# Patient Record
Sex: Female | Born: 1935 | Race: White | Hispanic: No | Marital: Single | Smoking: Former smoker
Health system: Southern US, Community
[De-identification: ages and names within clinical notes are randomized; demographics above are authoritative.]

## PROBLEM LIST (undated history)

## (undated) DIAGNOSIS — I10 Essential (primary) hypertension: Secondary | ICD-10-CM

## (undated) DIAGNOSIS — J45909 Unspecified asthma, uncomplicated: Secondary | ICD-10-CM

## (undated) DIAGNOSIS — C801 Malignant (primary) neoplasm, unspecified: Secondary | ICD-10-CM

## (undated) DIAGNOSIS — R7303 Prediabetes: Secondary | ICD-10-CM

## (undated) DIAGNOSIS — M1990 Unspecified osteoarthritis, unspecified site: Secondary | ICD-10-CM

## (undated) HISTORY — PX: APPENDECTOMY: SHX54

## (undated) HISTORY — PX: TONSILLECTOMY: SUR1361

## (undated) HISTORY — PX: CHOLECYSTECTOMY: SHX55

## (undated) HISTORY — DX: Essential (primary) hypertension: I10

## (undated) HISTORY — PX: BACK SURGERY: SHX140

## (undated) HISTORY — PX: OTHER SURGICAL HISTORY: SHX169

---

## 2000-06-17 ENCOUNTER — Encounter (HOSPITAL_COMMUNITY): Admission: RE | Admit: 2000-06-17 | Discharge: 2000-09-15 | Payer: Self-pay | Admitting: Orthopedic Surgery

## 2000-06-17 ENCOUNTER — Encounter: Payer: Self-pay | Admitting: Orthopedic Surgery

## 2000-07-27 ENCOUNTER — Encounter: Payer: Self-pay | Admitting: Orthopedic Surgery

## 2000-07-31 ENCOUNTER — Inpatient Hospital Stay (HOSPITAL_COMMUNITY): Admission: RE | Admit: 2000-07-31 | Discharge: 2000-08-03 | Payer: Self-pay | Admitting: Orthopedic Surgery

## 2000-07-31 ENCOUNTER — Encounter: Payer: Self-pay | Admitting: Orthopedic Surgery

## 2000-12-30 ENCOUNTER — Other Ambulatory Visit: Admission: RE | Admit: 2000-12-30 | Discharge: 2000-12-30 | Payer: Self-pay | Admitting: Otolaryngology

## 2014-06-19 DIAGNOSIS — M1711 Unilateral primary osteoarthritis, right knee: Secondary | ICD-10-CM | POA: Insufficient documentation

## 2014-06-19 DIAGNOSIS — E559 Vitamin D deficiency, unspecified: Secondary | ICD-10-CM | POA: Insufficient documentation

## 2014-06-19 DIAGNOSIS — I10 Essential (primary) hypertension: Secondary | ICD-10-CM | POA: Insufficient documentation

## 2014-06-19 DIAGNOSIS — E039 Hypothyroidism, unspecified: Secondary | ICD-10-CM | POA: Insufficient documentation

## 2015-10-28 DIAGNOSIS — Z6835 Body mass index (BMI) 35.0-35.9, adult: Secondary | ICD-10-CM | POA: Insufficient documentation

## 2015-10-28 DIAGNOSIS — E875 Hyperkalemia: Secondary | ICD-10-CM | POA: Insufficient documentation

## 2015-10-28 DIAGNOSIS — H16223 Keratoconjunctivitis sicca, not specified as Sjogren's, bilateral: Secondary | ICD-10-CM | POA: Insufficient documentation

## 2015-10-28 DIAGNOSIS — H02831 Dermatochalasis of right upper eyelid: Secondary | ICD-10-CM | POA: Insufficient documentation

## 2015-10-28 DIAGNOSIS — N183 Chronic kidney disease, stage 3 unspecified: Secondary | ICD-10-CM | POA: Insufficient documentation

## 2015-10-28 DIAGNOSIS — H9190 Unspecified hearing loss, unspecified ear: Secondary | ICD-10-CM | POA: Insufficient documentation

## 2015-10-28 DIAGNOSIS — K219 Gastro-esophageal reflux disease without esophagitis: Secondary | ICD-10-CM | POA: Insufficient documentation

## 2015-10-28 DIAGNOSIS — Z79899 Other long term (current) drug therapy: Secondary | ICD-10-CM | POA: Insufficient documentation

## 2015-10-28 DIAGNOSIS — M542 Cervicalgia: Secondary | ICD-10-CM | POA: Insufficient documentation

## 2015-10-28 DIAGNOSIS — H2513 Age-related nuclear cataract, bilateral: Secondary | ICD-10-CM | POA: Insufficient documentation

## 2015-10-28 DIAGNOSIS — M1990 Unspecified osteoarthritis, unspecified site: Secondary | ICD-10-CM | POA: Insufficient documentation

## 2015-10-28 DIAGNOSIS — H02834 Dermatochalasis of left upper eyelid: Secondary | ICD-10-CM | POA: Insufficient documentation

## 2015-10-28 DIAGNOSIS — B009 Herpesviral infection, unspecified: Secondary | ICD-10-CM | POA: Insufficient documentation

## 2015-10-28 DIAGNOSIS — H43391 Other vitreous opacities, right eye: Secondary | ICD-10-CM | POA: Insufficient documentation

## 2016-04-03 DIAGNOSIS — H2512 Age-related nuclear cataract, left eye: Secondary | ICD-10-CM | POA: Insufficient documentation

## 2016-05-06 DIAGNOSIS — Z0000 Encounter for general adult medical examination without abnormal findings: Secondary | ICD-10-CM | POA: Insufficient documentation

## 2016-05-06 DIAGNOSIS — R7301 Impaired fasting glucose: Secondary | ICD-10-CM | POA: Insufficient documentation

## 2017-04-29 DIAGNOSIS — R200 Anesthesia of skin: Secondary | ICD-10-CM | POA: Insufficient documentation

## 2017-10-09 DIAGNOSIS — G5601 Carpal tunnel syndrome, right upper limb: Secondary | ICD-10-CM | POA: Insufficient documentation

## 2018-01-06 DIAGNOSIS — M79661 Pain in right lower leg: Secondary | ICD-10-CM | POA: Insufficient documentation

## 2018-01-06 DIAGNOSIS — M79662 Pain in left lower leg: Secondary | ICD-10-CM | POA: Insufficient documentation

## 2018-05-10 DIAGNOSIS — R739 Hyperglycemia, unspecified: Secondary | ICD-10-CM | POA: Insufficient documentation

## 2018-09-10 DIAGNOSIS — R7303 Prediabetes: Secondary | ICD-10-CM | POA: Insufficient documentation

## 2019-03-16 DIAGNOSIS — E871 Hypo-osmolality and hyponatremia: Secondary | ICD-10-CM | POA: Insufficient documentation

## 2019-11-30 ENCOUNTER — Ambulatory Visit: Payer: Medicare PPO | Admitting: Physical Medicine and Rehabilitation

## 2019-11-30 ENCOUNTER — Ambulatory Visit (INDEPENDENT_AMBULATORY_CARE_PROVIDER_SITE_OTHER): Payer: Medicare PPO

## 2019-11-30 ENCOUNTER — Other Ambulatory Visit: Payer: Self-pay

## 2019-11-30 ENCOUNTER — Encounter: Payer: Self-pay | Admitting: Physical Medicine and Rehabilitation

## 2019-11-30 VITALS — BP 134/71 | HR 69 | Ht 60.0 in | Wt 180.0 lb

## 2019-11-30 DIAGNOSIS — Z8719 Personal history of other diseases of the digestive system: Secondary | ICD-10-CM | POA: Insufficient documentation

## 2019-11-30 DIAGNOSIS — G8929 Other chronic pain: Secondary | ICD-10-CM

## 2019-11-30 DIAGNOSIS — M25551 Pain in right hip: Secondary | ICD-10-CM

## 2019-11-30 DIAGNOSIS — M961 Postlaminectomy syndrome, not elsewhere classified: Secondary | ICD-10-CM

## 2019-11-30 DIAGNOSIS — M25512 Pain in left shoulder: Secondary | ICD-10-CM

## 2019-11-30 DIAGNOSIS — M25552 Pain in left hip: Secondary | ICD-10-CM | POA: Diagnosis not present

## 2019-11-30 DIAGNOSIS — M1611 Unilateral primary osteoarthritis, right hip: Secondary | ICD-10-CM

## 2019-11-30 DIAGNOSIS — E7800 Pure hypercholesterolemia, unspecified: Secondary | ICD-10-CM | POA: Insufficient documentation

## 2019-11-30 DIAGNOSIS — M545 Low back pain: Secondary | ICD-10-CM | POA: Diagnosis not present

## 2019-11-30 DIAGNOSIS — M25511 Pain in right shoulder: Secondary | ICD-10-CM

## 2019-11-30 DIAGNOSIS — M1612 Unilateral primary osteoarthritis, left hip: Secondary | ICD-10-CM

## 2019-11-30 NOTE — Progress Notes (Signed)
Pt states pain mostly across the lower back and left hip (no groin pain). Pt states pain started several years ago and has gotten worse in the past few months. Pt states walking and laying on left side makes pain worse. Tylenol helps with pain.  Pt also states pain in both shoulders, left arm, and left hand. Pt states pain started years ago. Pt states laying down at night makes shoulders and arm hurt. Tylenol helps with pain.   .Numeric Pain Rating Scale and Functional Assessment Average Pain 5 Pain Right Now 2 My pain is intermittent, dull and aching Pain is worse with: some activites Pain improves with: medication   In the last MONTH (on 0-10 scale) has pain interfered with the following?  1. General activity like being  able to carry out your everyday physical activities such as walking, climbing stairs, carrying groceries, or moving a chair?  Rating(6)  2. Relation with others like being able to carry out your usual social activities and roles such as  activities at home, at work and in your community. Rating(6)  3. Enjoyment of life such that you have  been bothered by emotional problems such as feeling anxious, depressed or irritable?  Rating(4)

## 2019-12-15 ENCOUNTER — Ambulatory Visit: Payer: Self-pay

## 2019-12-15 ENCOUNTER — Ambulatory Visit (INDEPENDENT_AMBULATORY_CARE_PROVIDER_SITE_OTHER): Payer: Medicare PPO | Admitting: Physical Medicine and Rehabilitation

## 2019-12-15 ENCOUNTER — Encounter: Payer: Self-pay | Admitting: Physical Medicine and Rehabilitation

## 2019-12-15 ENCOUNTER — Other Ambulatory Visit: Payer: Self-pay

## 2019-12-15 VITALS — BP 130/57 | HR 64

## 2019-12-15 DIAGNOSIS — M25551 Pain in right hip: Secondary | ICD-10-CM | POA: Diagnosis not present

## 2019-12-15 DIAGNOSIS — M25552 Pain in left hip: Secondary | ICD-10-CM

## 2019-12-15 NOTE — Progress Notes (Signed)
 .  Numeric Pain Rating Scale and Functional Assessment Average Pain 8   In the last MONTH (on 0-10 scale) has pain interfered with the following?  1. General activity like being  able to carry out your everyday physical activities such as walking, climbing stairs, carrying groceries, or moving a chair?  Rating(8)   +Driver, -BT, -Dye Allergies.  

## 2019-12-19 MED ORDER — TRIAMCINOLONE ACETONIDE 40 MG/ML IJ SUSP
60.0000 mg | INTRAMUSCULAR | Status: AC | PRN
  Administered 2019-12-19: 06:00:00 60 mg via INTRA_ARTICULAR

## 2019-12-19 MED ORDER — BUPIVACAINE HCL 0.25 % IJ SOLN
4.0000 mL | INTRAMUSCULAR | Status: AC | PRN
  Administered 2019-12-19: 06:00:00 4 mL via INTRA_ARTICULAR

## 2019-12-19 NOTE — Progress Notes (Signed)
Holly Simpson - 84 y.o. female MRN [id number]  Date of birth: April 18, 1936  Office Visit Note: Visit Date: 12/15/2019 PCP: Earnie Larsson, PA-C Referred by: Earnie Larsson, PA-C  Subjective: Chief Complaint  Patient presents with  . Right Hip - Pain, Injections  . Left Hip - Pain, Injections   HPI:  Holly Simpson is a 84 y.o. female who comes in today For planned bilateral intra-articular hip injections with fluoroscopic guidance.  She has left groin pain much more than right but both sides hurt with walking.  She is ambulating with antalgic gait using a cane.  The patient has failed conservative care including home exercise, medications, time and activity modification.  This injection will be diagnostic and hopefully therapeutic.  Please see requesting physician notes for further details and justification. Diagnostic arthrogram performed on the left.  ROS Otherwise per HPI.  Assessment & Plan: Visit Diagnoses:  1. Pain in left hip   2. Pain in right hip     Plan: No additional findings.   Meds & Orders: No orders of the defined types were placed in this encounter.   Orders Placed This Encounter  Procedures  . Large Joint Inj  . Large Joint Inj  . XR C-ARM NO REPORT    Follow-up: Return if symptoms worsen or fail to improve.   Procedures: Large Joint Inj: L hip joint (Left) on 12/19/2019 6:01 AM Indications: pain and diagnostic evaluation Details: 22 G 3.5 in needle, anterior approach  Arthrogram: Yes  Medications: 4 mL bupivacaine 0.25 %; 60 mg triamcinolone acetonide 40 MG/ML Outcome: tolerated well, no immediate complications  Arthrogram demonstrated excellent flow of contrast throughout the joint surface without extravasation or obvious defect.  The patient had relief of symptoms during the anesthetic phase of the injection.  Procedure, treatment alternatives, risks and benefits explained, specific risks discussed. Consent was given by the patient.  Immediately prior to procedure a time out was called to verify the correct patient, procedure, equipment, support staff and site/side marked as required. Patient was prepped and draped in the usual sterile fashion.   Large Joint Inj: R hip joint (Right) on 12/19/2019 6:02 AM Indications: diagnostic evaluation and pain Details: 22 G 3.5 in needle, fluoroscopy-guided anterior approach  Arthrogram: No  Medications: 4 mL bupivacaine 0.25 %; 60 mg triamcinolone acetonide 40 MG/ML Outcome: tolerated well, no immediate complications  There was excellent flow of contrast producing a partial arthrogram of the hip. The patient did have relief of symptoms during the anesthetic phase of the injection. Procedure, treatment alternatives, risks and benefits explained, specific risks discussed. Consent was given by the patient. Immediately prior to procedure a time out was called to verify the correct patient, procedure, equipment, support staff and site/side marked as required. Patient was prepped and draped in the usual sterile fashion.      No notes on file   Clinical History: EDX Right upper  Test Date: 08/19/2017   Patient: [name] DOB: August 27, 1936 Physician: Ardean Larsen, M.D.  Sex: Female Height: 5\' 0"  Ref Phys: Addison Naegeli, M.D.  ID#[id number]     Patient Complaints: Right hand numbness    Patient History / Exam: 84 year old right handed woman presents  for a study to evaluate right hand numbness. On exam she has a  positive Tinel's sign at right carpal tunnel, decreased sharp  sensation right thumb and index finger and weakness of right  thumb abduction.    NCV &  EMG Findings:  Evaluation of the right median motor nerve showed reduced  amplitude (0.4 mV) and decreased conduction velocity  (Elbow-Wrist, 44 m/s). The right median sensory nerve showed no  response (Palm) and no response (2nd Digit). All remaining  nerves (as indicated in the following tables) were  within normal  limits.    Impression: This is an abnormal study. There is  electrophysiological evidence of a right median mononeuropathy at  the wrist compatible with severe right carpal tunnel syndrome.   Recommendations: Patient will be referred to Orthopedic Hand  surgery for right carpal tunnel release. She was instructed to  wear a right carpal tunnel splint as much as possible until then.    _____________________________  Ardean Larsen, M.D.  Board Certified in Neurology  Board Certified in [facility] ---[place], 09/17/2016 1:06 PM   INDICATION: LOW BACK PAIN, >6 WKS / RED FLAG(S) / RADICULOPATHY \ \ M54.5 Spine pain, lumbar \ Z98.1 S/P lumbar fusion \ R20.0 Numbness and tingling of both lower extremities \ R20.2 Numbness and tingling of both lower extremities   COMPARISON: Multiple, most recent: Lumbar spine radiograph dated 08/12/2016. Reference CT abdomen 03/17/2011.   TECHNIQUE: Multiplanar, multi-sequence surface-coil MR imaging of the lumbar spine was performed without contrast.   LEVELS IMAGED: Lower thoracic to upper sacral region.   FINDINGS:  Alignment: Similar grade 1 L4 on L5 anterolisthesis. Mild dextroconvex curvature of the lumbar spine. Vertebrae: Surgical changes from L4-L5 PLIF. No marrow signal abnormalities to suggest neoplasm. Conus: In normal position without imaging evidence for tethering. Normal signal and contour.  T12-L1: No significant focal abnormality. L1-L2: No significant focal abnormality. L2-L3: Disc bulge, facet hypertrophy, and ligamentum flavum thickening result in mild canal narrowing, severe left foraminal narrowing, and mild left foraminal narrowing. L3-L4: Disc bulge and facet hypertrophy with mild canal narrowing, moderate to severe left foraminal narrowing, and at least moderate right foraminal narrowing. L4-L5: Grade 1 anterolisthesis with disc uncovering. Mild canal narrowing. L5-S1:  Small posterior disc bulge and mild bilateral foraminal narrowing without significant canal or foraminal stenosis. Upper sacrum/ilium: No significant focal abnormality.   CONCLUSION:  1.  Degenerative changes of the lumbar spine, most pronounced at L2-L3 and L3-L4. There is mild canal narrowing, severe left foraminal narrowing, and mild left foraminal narrowing at L2-L3. At L3-L4, there is mild canal narrowing, moderate to severe left foraminal narrowing, and at least moderate right foraminal narrowing. 2.  Postsurgical changes from L4-L5 PLIF.     Objective:  VS:  HT:    WT:   BMI:     BP:(!) 130/57  HR:64bpm  TEMP: ( )  RESP:  Physical Exam  Ortho Exam Imaging: No results found.

## 2019-12-20 ENCOUNTER — Ambulatory Visit: Payer: Medicare PPO | Admitting: Family Medicine

## 2019-12-20 ENCOUNTER — Other Ambulatory Visit: Payer: Self-pay

## 2019-12-20 ENCOUNTER — Encounter: Payer: Self-pay | Admitting: Family Medicine

## 2019-12-20 DIAGNOSIS — M79672 Pain in left foot: Secondary | ICD-10-CM | POA: Diagnosis not present

## 2019-12-20 DIAGNOSIS — M79671 Pain in right foot: Secondary | ICD-10-CM | POA: Diagnosis not present

## 2019-12-20 NOTE — Progress Notes (Signed)
I saw and examined the patient with Dr. Mayer Masker and agree with assessment and plan as outlined.    Bilateral pes planus with chronic foot pain.  Significantly improved after recent hip injections.  Has bunion deformity on the right.  Good ROM of her toes.    Elected to try OTC inserts at [employer].  If symptoms worsen, then custom orthotics per Dr. Mayer Masker.

## 2019-12-20 NOTE — Progress Notes (Signed)
Holly Simpson - 84 y.o. female MRN [id number]  Date of birth: March 06, 1936  Office Visit Note: Visit Date: 12/20/2019 PCP: Earnie Larsson, PA-C Referred by: Earnie Larsson, PA-C  Subjective: Chief Complaint  Patient presents with  . Left Foot - Pain    Was having pain in the feet, but got a shot by Dr. Ernestina Patches and she is now doing fine. She states that she has been to the [employer] store and was told she was flat footed and told something different. Wants to know you opinion if you think she needs them  . Right Foot - Pain   HPI: Holly Simpson is a 84 y.o. female who comes in today with bilateral foot pain. She reports that pain in feet has been bothering her for years, but 5 days ago she had bilateral hip injections by Dr. Ernestina Patches and has pain relief in feet as well. She has been told by [employer] store that she has flat feet and has tried complicated orthotics without much relief. She was told by another store that she does not have flat feet. When pain was hurting before, it was mostly in lateral feet. She reports bunion on right foot and known arthritis in 4th MTP. She does not use the internet and would rather go to a store than purchase something online. She would like something she can easily slide into her shoes.    ROS Otherwise per HPI.  Assessment & Plan: Visit Diagnoses:  1. Bilateral foot pain     Plan: Pes planus bilaterally with mild pronation on right with walking. Recommended insoles at [employer] to provide arch support, which are easy to slide into shoes. If these do not help, she can come to Sports Medicine office and we can try [employer] inserts in shoes and possibly orthotics if she would like.  Meds & Orders: No orders of the defined types were placed in this encounter.  No orders of the defined types were placed in this encounter.   Follow-up: PRN  Procedures: No procedures performed  No notes on file   Clinical History: No specialty comments  available.   She reports that she has never smoked. She has never used smokeless tobacco. No results for input(s): HGBA1C, LABURIC in the last 8760 hours.  Objective:  VS:  HT:    WT:   BMI:     BP:   HR: bpm  TEMP: ( )  RESP:  Physical Exam  PHYSICAL EXAM: Gen: NAD, alert, cooperative with exam, well-appearing HEENT: clear conjunctiva,  CV:  no edema, capillary refill brisk, normal rate Resp: non-labored Skin: no rashes, normal turgor  Neuro: no gross deficits.  Psych:  alert and oriented  Ortho Exam  Feet: Inspection: Bilateral feet with small arch at rest, flattens to pes planus with standing. Mild valgus deformity of 1st toe on right. Palpation: No tenderness to palpation ROM: Full  ROM of the ankle. Normal midfoot flexibility Strength: 5/5 strength ankle in all planes Neurovascular: N/V intact distally in the lower extremity Special tests: No pain with MT squeeze or palpation of MTPs. Normal midfoot flexibility. Normal calcaneal motion with heel raise  Past Medical/Family/Surgical/Social History: Medications & Allergies reviewed per EMR, new medications updated. Patient Active Problem List   Diagnosis Date Noted  . H/O acute pancreatitis 11/30/2019  . High cholesterol 11/30/2019  . Hyponatremia 03/16/2019  . Prediabetes 09/10/2018  . Hyperglycemia 05/10/2018  . Pain in both lower legs 01/06/2018  .  Carpal tunnel syndrome of right wrist 10/09/2017  . Numbness and tingling in right hand 04/29/2017  . Encounter for health maintenance examination 05/06/2016  . Impaired fasting glucose 05/06/2016  . Nuclear sclerotic cataract of left eye 04/03/2016  . Age-related nuclear cataract of both eyes 10/28/2015  . Chronic kidney disease, stage III (moderate) 10/28/2015  . Dermatochalasis of both upper eyelids 10/28/2015  . GERD without esophagitis 10/28/2015  . Hearing loss 10/28/2015  . High risk medication use 10/28/2015  . HSV-1 infection 10/28/2015  . Hyperkalemia  10/28/2015  . Keratoconjunctivitis sicca of both eyes not specified as Sjogren's 10/28/2015  . Neck pain 10/28/2015  . Class 2 severe obesity due to excess calories with serious comorbidity and body mass index (BMI) of 35.0 to 35.9 in adult [facility]) 10/28/2015  . Osteoarthritis 10/28/2015  . Vitreous floaters of right eye 10/28/2015  . High blood pressure 06/19/2014  . Hypothyroidism 06/19/2014  . Primary osteoarthritis of right knee 06/19/2014  . Vitamin D deficiency 06/19/2014   History reviewed. No pertinent past medical history. History reviewed. No pertinent family history. History reviewed. No pertinent surgical history. Social History   Occupational History  . Not on file  Tobacco Use  . Smoking status: Never Smoker  . Smokeless tobacco: Never Used  Substance and Sexual Activity  . Alcohol use: Never  . Drug use: Never  . Sexual activity: Not on file

## 2020-02-13 ENCOUNTER — Other Ambulatory Visit: Payer: Self-pay | Admitting: Radiology

## 2020-02-16 ENCOUNTER — Encounter: Payer: Self-pay | Admitting: *Deleted

## 2020-02-16 DIAGNOSIS — D0511 Intraductal carcinoma in situ of right breast: Secondary | ICD-10-CM | POA: Insufficient documentation

## 2020-02-21 ENCOUNTER — Encounter: Payer: Self-pay | Admitting: Physical Medicine and Rehabilitation

## 2020-02-21 NOTE — Progress Notes (Signed)
Holly Simpson - 84 y.o. female MRN [id number]  Date of birth: 1936-03-12  Office Visit Note: Visit Date: 11/30/2019 PCP: Earnie Larsson, PA-C Referred by: No ref. provider found  Subjective: Chief Complaint  Patient presents with  . Right Shoulder - Pain  . Left Shoulder - Pain  . Left Hand - Pain  . Lower Back - Pain  . Left Hip - Pain   HPI: Holly Simpson is a 84 y.o. female who comes in today As a self-referral new patient evaluation and management for multiple issues.  Her main complaint is pain across the lower back and left hip pain with some referral to the anterior thigh and posterior buttock region.  She also has pain in both shoulders particularly the left arm and left hand with paresthesia.  In terms of her back pain she has had history of prior lumbar instrumented fusion at L4-5.  She had MRI from 2017 which we do have the report from.  This is reviewed with the patient today and reviewed below.  I did review probably 50 pages of notes that are available on the care everywhere portion of epic.  She has not had any recent imaging of the back.  No specific new injury.  She just reports the pain started several years ago and she relates all the pain to her spine.  She does have pain with walking and movement of the left hip particularly with getting up and down from a seated position.  Much more than the right although the right is bothering her as well.  She says the last few months this is progressively worsened.  She denies any paresthesias in the legs.  No focal weakness.  She is ambulating now not very well with a cane.  She reports some pain laying on the left side as well.  Has been using Tylenol.  She has had therapy in the past but not recently.  In terms of her shoulder pain she does have bilateral shoulder pain with movement.  Pain worse overhead.  She also has symptoms into the left arm and hand with paresthesia more of a radial digit distribution.  She has a  history of prior right-sided carpal tunnel release at [facility].  We did see a electrodiagnostic study from 2018 showing severe median neuropathy at the wrist on the right.  It does not appear that the left was tested.  She does have some neck pain at times but does not necessarily relate the 2.  Her average pain she describes as a 5 out of 10 but it seems to really limit what she can do daily and is just getting worse.  Review of Systems  Musculoskeletal: Positive for back pain and joint pain.  Neurological: Positive for tingling.  All other systems reviewed and are negative.  Otherwise per HPI.  Assessment & Plan: Visit Diagnoses:  1. Chronic bilateral low back pain without sciatica   2. Post laminectomy syndrome   3. Pain in left hip   4. Pain in right hip   5. Unilateral primary osteoarthritis, left hip   6. Unilateral primary osteoarthritis, right hip   7. Chronic pain of both shoulders     Plan: Findings:  1.  Chronic long-term history of back pain status post lumbar fusion at L4-5 with x-ray showing good anatomic alignment at the level of the fusion but there is scoliosis at L2 and she does have arthritic changes below the fusion.  MRI from  a few years ago is very similar.  She does have pretty severe hip arthritis on the left.  Her pain is really consistent with left hip arthritis on exam.  She does have pain on the right as well with hip rotation.  Some greater trochanteric pain as well.  I think at this point the best approach is to start out with the intra-articular anesthetic hip arthrogram on the left to just see how much of her pain is related to the hip and not her back or spine per se.  Could look at diagnostic medial branch blocks across the lower facet joints.  Need to get her back into a physical therapy program depending on relief with the injection.  She will continue with current medications but consider changes in that as well.  We did talk about exercise and  weight loss.  We spent more than 40 minutes discussing her complaints and diagnoses as well as treatment plan.  2.  In terms of her left shoulder and arm pain and hand pain I think this may be due to carpal tunnel issues as she did have those on the right.  She did undergo carpal tunnel release on the right.  I think the next step depending on relief with her back would be to look at electrodiagnostic study of the left hand.  Could entertain the idea of imaging of the cervical spine.  Again working with physical therapy potentially depending on at what stage of this were looking at.  3. As an aside she did bring up bilateral foot pain and the use of prior inserts.  I want to refer her to Dr. Eunice Blase for further evaluation management of her feet.    Meds & Orders: No orders of the defined types were placed in this encounter.   Orders Placed This Encounter  Procedures  . XR Lumbar Spine Complete    Follow-up: Return for Left intra-articular anesthetic hip arthrogram.   Procedures: No procedures performed  No notes on file   Clinical History: EDX Right upper  Test Date: 08/19/2017   Patient: Holly Simpson DOB: 1936-08-08 Physician: Ardean Larsen, M.D.  Sex: Female Height: 5\' 0"  Ref Phys: Addison Naegeli, M.D.  ID#[id number]     Patient Complaints: Right hand numbness    Patient History / Exam: 84 year old right handed woman presents  for a study to evaluate right hand numbness. On exam she has a  positive Tinel's sign at right carpal tunnel, decreased sharp  sensation right thumb and index finger and weakness of right  thumb abduction.    NCV & EMG Findings:  Evaluation of the right median motor nerve showed reduced  amplitude (0.4 mV) and decreased conduction velocity  (Elbow-Wrist, 44 m/s). The right median sensory nerve showed no  response (Palm) and no response (2nd Digit). All remaining  nerves (as indicated in the following tables) were within normal    limits.    Impression: This is an abnormal study. There is  electrophysiological evidence of a right median mononeuropathy at  the wrist compatible with severe right carpal tunnel syndrome.   Recommendations: Patient will be referred to Orthopedic Hand  surgery for right carpal tunnel release. She was instructed to  wear a right carpal tunnel splint as much as possible until then.    _____________________________  Ardean Larsen, M.D.  Board Certified in Neurology  Board Certified in [facility] ---[place], 09/17/2016 1:06 PM  INDICATION: LOW BACK PAIN, >6 WKS / RED FLAG(S) / RADICULOPATHY \ \ M54.5 Spine pain, lumbar \ Z98.1 S/P lumbar fusion \ R20.0 Numbness and tingling of both lower extremities \ R20.2 Numbness and tingling of both lower extremities   COMPARISON: Multiple, most recent: Lumbar spine radiograph dated 08/12/2016. Reference CT abdomen 03/17/2011.   TECHNIQUE: Multiplanar, multi-sequence surface-coil MR imaging of the lumbar spine was performed without contrast.   LEVELS IMAGED: Lower thoracic to upper sacral region.   FINDINGS:  Alignment: Similar grade 1 L4 on L5 anterolisthesis. Mild dextroconvex curvature of the lumbar spine. Vertebrae: Surgical changes from L4-L5 PLIF. No marrow signal abnormalities to suggest neoplasm. Conus: In normal position without imaging evidence for tethering. Normal signal and contour.  T12-L1: No significant focal abnormality. L1-L2: No significant focal abnormality. L2-L3: Disc bulge, facet hypertrophy, and ligamentum flavum thickening result in mild canal narrowing, severe left foraminal narrowing, and mild left foraminal narrowing. L3-L4: Disc bulge and facet hypertrophy with mild canal narrowing, moderate to severe left foraminal narrowing, and at least moderate right foraminal narrowing. L4-L5: Grade 1 anterolisthesis with disc uncovering. Mild canal narrowing. L5-S1: Small posterior  disc bulge and mild bilateral foraminal narrowing without significant canal or foraminal stenosis. Upper sacrum/ilium: No significant focal abnormality.   CONCLUSION:  1.  Degenerative changes of the lumbar spine, most pronounced at L2-L3 and L3-L4. There is mild canal narrowing, severe left foraminal narrowing, and mild left foraminal narrowing at L2-L3. At L3-L4, there is mild canal narrowing, moderate to severe left foraminal narrowing, and at least moderate right foraminal narrowing. 2.  Postsurgical changes from L4-L5 PLIF.   She reports that she has never smoked. She has never used smokeless tobacco. No results for input(s): HGBA1C, LABURIC in the last 8760 hours.  Objective:  VS:  HT:5' (152.4 cm)   WT:180 lb (81.6 kg)  BMI:35.15    BP:134/71  HR:69bpm  TEMP: ( )  RESP:  Physical Exam Vitals and nursing note reviewed.  Constitutional:      General: She is not in acute distress.    Appearance: Normal appearance. She is well-developed. She is obese. She is not ill-appearing.  HENT:     Head: Normocephalic and atraumatic.  Eyes:     Conjunctiva/sclera: Conjunctivae normal.     Pupils: Pupils are equal, round, and reactive to light.  Cardiovascular:     Rate and Rhythm: Normal rate.     Pulses: Normal pulses.  Pulmonary:     Effort: Pulmonary effort is normal.  Musculoskeletal:     Cervical back: Normal range of motion. Rigidity present. No tenderness.     Right lower leg: No edema.     Left lower leg: No edema.     Comments: Patient has a lot of difficulty going from sit to stand she does have some pain of the lower spine with extension and facet loading.  She does have myofascial trigger points in the quadratus lumborum bilaterally.  She has mild pain over the left greater trochanter than the right.  She has pretty exquisite pain with left internal rotation of the left hip.  She has good distal strength without clonus.  Examination of both hands shows prior carpal tunnel  release on the right with equivocal Phalen's test on the left.  She has intact sensation.  Some flattening of the APB but she does have osteoarthritic changes in the thumb which clouds the exam here.  She has shoulder impingement bilaterally but mild.  Skin:  General: Skin is warm and dry.     Findings: No erythema or rash.  Neurological:     General: No focal deficit present.     Mental Status: She is alert and oriented to person, place, and time.     Sensory: No sensory deficit.     Motor: No abnormal muscle tone.     Coordination: Coordination normal.     Gait: Gait normal.  Psychiatric:        Mood and Affect: Mood normal.        Behavior: Behavior normal.     Ortho Exam  Imaging: AP and lateral 4 view lumbar spine film dated today 4 view lumbar spine shows lumbar fusion at L4-5 with instrumentation with increased lordosis and calcification of the aorta.  There is moderate scoliosis leftward centered at about L2.  She has bilateral hip osteoarthritis severe on the left slightly less severe on the right.  Past Medical/Family/Surgical/Social History: Medications & Allergies reviewed per EMR, new medications updated. Past Medical History:  Diagnosis Date  . Arthritis  . Cancer ([place])  . Cataract  . Diabetes mellitus ([place])  . History of chronic kidney disease  . History of hypertension  . Hypertension  . Hypothyroidism   Past Surgical History:  Procedure Laterality Date  . APPENDECTOMY  . BACK SURGERY  . CARPAL TUNNEL RELEASE Right  Dec. 2018, [facility]  . CATARACT EXTRACTION W/ INTRAOCULAR LENS IMPLANT Left 04/03/2016  Dr. Tora Kindred, MD  . CHOLECYSTECTOMY  . COCHLEAR IMPLANT Left  . GALLBLADDER SURGERY  . TONSILLECTOMY AND ADENOIDECTOMY  . YAG CAPSULOTOMY Left 02/14/2019  Dr Deatra Ina   Patient Active Problem List   Diagnosis Date Noted  . Ductal carcinoma in situ (DCIS) of right breast 02/16/2020  . H/O acute pancreatitis 11/30/2019  . High cholesterol  11/30/2019  . Hyponatremia 03/16/2019  . Prediabetes 09/10/2018  . Hyperglycemia 05/10/2018  . Pain in both lower legs 01/06/2018  . Carpal tunnel syndrome of right wrist 10/09/2017  . Numbness and tingling in right hand 04/29/2017  . Encounter for health maintenance examination 05/06/2016  . Impaired fasting glucose 05/06/2016  . Nuclear sclerotic cataract of left eye 04/03/2016  . Age-related nuclear cataract of both eyes 10/28/2015  . Chronic kidney disease, stage III (moderate) 10/28/2015  . Dermatochalasis of both upper eyelids 10/28/2015  . GERD without esophagitis 10/28/2015  . Hearing loss 10/28/2015  . High risk medication use 10/28/2015  . HSV-1 infection 10/28/2015  . Hyperkalemia 10/28/2015  . Keratoconjunctivitis sicca of both eyes not specified as Sjogren's 10/28/2015  . Neck pain 10/28/2015  . Class 2 severe obesity due to excess calories with serious comorbidity and body mass index (BMI) of 35.0 to 35.9 in adult [facility]) 10/28/2015  . Osteoarthritis 10/28/2015  . Vitreous floaters of right eye 10/28/2015  . High blood pressure 06/19/2014  . Hypothyroidism 06/19/2014  . Primary osteoarthritis of right knee 06/19/2014  . Vitamin D deficiency 06/19/2014   History reviewed. No pertinent past medical history. History reviewed. No pertinent family history. History reviewed. No pertinent surgical history. Social History   Occupational History  . Not on file  Tobacco Use  . Smoking status: Never Smoker  . Smokeless tobacco: Never Used  Substance and Sexual Activity  . Alcohol use: Never  . Drug use: Never  . Sexual activity: Not on file

## 2020-02-21 NOTE — Progress Notes (Signed)
[place]  [phone] Fax:(336) [phone]     ID: Holly Simpson DOB: 1936/04/03  MR#: [id number]  VVZ#:[id number]  Patient Care Team: Holly Simpson as PCP - General (Physician Assistant) Holly Kaufmann, RN as Oncology Nurse Navigator Holly Germany, RN as Oncology Nurse Navigator Holly Bookbinder, MD as Consulting Physician (General Surgery) Holly Simpson, Holly Dad, MD as Consulting Physician (Oncology) Eppie Gibson, MD as Attending Physician (Radiation Oncology) Magnus Sinning, MD as Consulting Physician (Physical Medicine and Rehabilitation) Chauncey Cruel, MD OTHER MD:  CHIEF COMPLAINT: estrogen receptor positive noninvasive breast cancer  CURRENT TREATMENT: Anastrozole   HISTORY OF CURRENT ILLNESS: "Holly Simpson" herself palpated a left breast lump, as well as noted associated tenderness. She underwent bilateral diagnostic mammography with tomography and right breast ultrasonography at [facility] on 01/26/2020 showing: breast density category C; indeterminate 2 cm calcifications in linear distribution within right lower medial breast; possible 6 mm mass along anterior aspect of right lower medial calcifications; no abnormalities were seen sonographically in the right breast or axilla.  Accordingly on 02/13/2020 she proceeded to biopsy of the right breast calcifications in question. The pathology from this procedure ([id number]) showed: ductal carcinoma in situ, intermediate grade. Prognostic indicators significant for: estrogen receptor, 100% positive and progesterone receptor, 90% positive, both with strong staining intensity.  The patient's subsequent history is as detailed below.   INTERVAL HISTORY: Holly Simpson was evaluated in the multidisciplinary breast cancer clinic on 02/22/2020 accompanied by her cousin Holly Simpson.. Her case was also presented at the multidisciplinary breast cancer conference on the same day. At that time a preliminary  plan was proposed: Consider the COMET trial; antiestrogens, consider adjuvant radiation   REVIEW OF SYSTEMS: On the provided questionnaire, [name] reports "a little" cramping, wearing glasses, history of cataracts, hearing loss, crowns on all teeth, "weak bladder," some easy bruising, arthritis, some difficulty walking, and thyroid problems. The patient denies unusual headaches, visual changes, nausea, vomiting, stiff neck, dizziness, or gait imbalance. There has been no cough, phlegm production, or pleurisy, no chest pain or pressure, and no change in bowel or bladder habits. The patient denies fever, rash, bleeding, unexplained fatigue or unexplained weight loss. A detailed review of systems was otherwise entirely negative.   PAST MEDICAL HISTORY: Past Medical History:  Diagnosis Date   Hypertension     PAST SURGICAL HISTORY: History reviewed. No pertinent surgical history.  She has a history of skin cancer removal from her right ankle and left ear.   FAMILY HISTORY: History reviewed. No pertinent family history.  Her father dies at age 19 and her mother at age 83, both from heart issues. She has one brother and one sister. There is no family history of cancer to her knowledge.   GYNECOLOGIC HISTORY:  No LMP recorded. Menarche: [age] years old [place] P 0 LMP around age 50 Contraceptive: never used HRT: never used  Hysterectomy? no BSO? 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  She is single lives by herself, with no pets.   ADVANCED DIRECTIVES: The patient has named Holly Simpson as her healthcare power of attorney.  She can be reached at [phone].   HEALTH MAINTENANCE: Social History   Tobacco Use   Smoking status: Former Smoker   Smokeless tobacco: Never Used  Substance Use Topics   Alcohol use: Yes   Drug use: Never     Colonoscopy: date unsure, repeat not  indicated  PAP: "years"  ago  Bone density: date unsure   No Known Allergies  Current Outpatient Medications  Medication Sig Dispense Refill   amLODipine (NORVASC) 5 MG tablet amlodipine 5 mg tablet     aspirin EC 81 MG tablet Take 81 mg by mouth daily.     famotidine (PEPCID) 20 MG tablet famotidine 20 mg tablet     furosemide (LASIX) 20 MG tablet      glipiZIDE (GLUCOTROL XL) 2.5 MG 24 hr tablet Take by mouth.     levothyroxine (SYNTHROID) 125 MCG tablet      lisinopril (ZESTRIL) 10 MG tablet TAKE 1 TABLET(10 MG) BY MOUTH EVERY MORNING FOR HIGH BLOOD PRESSURE     Omega-3 Fatty Acids (FISH OIL) 1000 MG CAPS omega-3 acid ethyl esters 1 gram capsule     Vitamin D, Ergocalciferol, (DRISDOL) 1.25 MG (50000 UNIT) CAPS capsule      anastrozole (ARIMIDEX) 1 MG tablet Take 1 tablet (1 mg total) by mouth daily. 90 tablet 4   No current facility-administered medications for this visit.    OBJECTIVE: White woman who appears stated age  84:   02/22/20 1245  BP: 129/64  Pulse: 96  Resp: 18  Temp: 98.5 F (36.9 C)     Body mass index is 33.14 kg/m.   Wt Readings from Last 3 Encounters:  02/22/20 175 lb 6.4 oz (79.6 kg)  11/30/19 180 lb (81.6 kg)      ECOG FS:1 - Symptomatic but completely ambulatory  Ocular: Sclerae unicteric, pupils round and equal Ear-nose-throat: Wearing a mask Lymphatic: No cervical or supraclavicular adenopathy Lungs no rales or rhonchi Heart regular rate and rhythm Abd soft, nontender, positive bowel sounds MSK no focal spinal tenderness, no joint edema Neuro: non-focal, well-oriented, appropriate affect Breasts: The right breast is status post recent biopsy.  There is no palpable mass.  The left breast is benign.  Both axillae are benign.   LAB RESULTS:  CMP     Component Value Date/Time   NA 135 02/22/2020 1221   K 4.3 02/22/2020 1221   CL 100 02/22/2020 1221   CO2 24 02/22/2020 1221   GLUCOSE 152 (H) 02/22/2020 1221   BUN 18 02/22/2020 1221   CREATININE  1.49 (H) 02/22/2020 1221   CALCIUM 9.4 02/22/2020 1221   PROT 7.4 02/22/2020 1221   ALBUMIN 3.8 02/22/2020 1221   AST 21 02/22/2020 1221   ALT 19 02/22/2020 1221   ALKPHOS 76 02/22/2020 1221   BILITOT 0.7 02/22/2020 1221   GFRNONAA 32 (L) 02/22/2020 1221   GFRAA 38 (L) 02/22/2020 1221    No results found for: TOTALPROTELP, ALBUMINELP, A1GS, A2GS, BETS, BETA2SER, GAMS, MSPIKE, SPEI  Lab Results  Component Value Date   WBC 7.9 02/22/2020   NEUTROABS 5.1 02/22/2020   HGB 15.1 (H) 02/22/2020   HCT 46.8 (H) 02/22/2020   MCV 89.1 02/22/2020   PLT 227 02/22/2020    No results found for: LABCA2  No components found for: AYTKZS010  No results for input(s): INR in the last 168 hours.  No results found for: LABCA2  No results found for: XNA355  No results found for: DDU202  No results found for: RKY706  No results found for: CA2729  No components found for: HGQUANT  No results found for: CEA1 / No results found for: CEA1   No results found for: AFPTUMOR  No results found for: CHROMOGRNA  No results found for: KPAFRELGTCHN, LAMBDASER, KAPLAMBRATIO (kappa/lambda light chains)  No  results found for: HGBA, HGBA2QUANT, HGBFQUANT, HGBSQUAN (Hemoglobinopathy evaluation)   No results found for: LDH  No results found for: IRON, TIBC, IRONPCTSAT (Iron and TIBC)  No results found for: FERRITIN  Urinalysis No results found for: COLORURINE, APPEARANCEUR, LABSPEC, PHURINE, GLUCOSEU, HGBUR, BILIRUBINUR, KETONESUR, PROTEINUR, UROBILINOGEN, NITRITE, LEUKOCYTESUR   STUDIES: No results found.   ELIGIBLE FOR AVAILABLE RESEARCH PROTOCOL: Decided against COMET trial  ASSESSMENT: 84 y.o. [place], [place] woman status post left breast biopsy 02/13/2020 for ductal carcinoma in situ, grade 2, strongly estrogen and progesterone receptor positive  (a) the patient originally palpated a mass, subsequent ultrasound did not document a mass  (1) patient opted for intensified screening with  no surgery or radiation (and opted against COMET)  (2) anastrozole started 02/22/2020  PLAN: I met today with Holly Simpson to review her new diagnosis. Specifically we discussed the biology of her breast cancer, its diagnosis, staging, treatment  options and prognosis. Holly Simpson understands that in noninvasive ductal carcinoma, also called ductal carcinoma in situ ("DCIS") the breast cancer cells remain trapped in the ducts were they started. They cannot travel to a vital organ. For that reason these cancers in themselves are not life-threatening.  We first discussed standard of care.  If the whole breast is removed then all the ducts are removed and since the cancer cells are trapped in the ducts, the cure rate with mastectomy for noninvasive breast cancer is approximately 99%. Nevertheless we recommend lumpectomy, because there is no survival advantage to mastectomy and because the cosmetic result is generally superior with breast conservation.  Since the patient is keeping her breast, there will be some risk of recurrence. The recurrence can only be in the same breast since, again, the cells are trapped in the ducts. There is no connection from one breast to the other. The risk of local recurrence is cut by more than half with radiation, which is standard in this situation.  In estrogen receptor positive cancers like Holly Simpson's, anti-estrogens can also be considered. They will further reduce the risk of recurrence by one half. In addition anti-estrogens will lower the risk of a new breast cancer developing in either breast, also by one half. That risk approaches 1% per year.   After discussing standard of care we discussed the COMET trial in detail.  The patient also met with one of the research nurses.  In the end what Holly Simpson wants to do is bypass surgery and radiation, start anastrozole, and do intensified screening.  This is essentially what was proposed in the observation arm of the comet trial but  the patient did not want to be randomized.  She will start anastrozole.  We discussed the possible toxicities side effects and complications of this agent.  I am going to see her in 2 months to make sure she is tolerating it well.  In 3 months we will repeat an ultrasound of the breast but recall there was no mass noted, only an area of calcifications.  Alternatively we can obtain a baseline MRI at that time.  Holly Simpson has a good understanding of the overall plan. She agrees with it. She knows the goal of treatment in her case is cure. She will call with any problems that may develop before her next visit here.  Total encounter time 65 minutes.Sarajane Jews C. Ela Moffat, MD 02/22/2020 3:58 PM Medical Oncology and Hematology [facility] [place], [place] [address] Tel. [phone]    Fax. [phone]   This  document serves as a record of services personally performed by Lurline Del, MD. It was created on his behalf by Wilburn Mylar, a trained medical scribe. The creation of this record is based on the scribe's personal observations and the provider's statements to them.   I, Lurline Del MD, have reviewed the above documentation for accuracy and completeness, and I agree with the above.    *Total Encounter Time as defined by the Centers for Medicare and Medicaid Services includes, in addition to the face-to-face time of a patient visit (documented in the note above) non-face-to-face time: obtaining and reviewing outside history, ordering and reviewing medications, tests or procedures, care coordination (communications with other health care professionals or caregivers) and documentation in the medical record.

## 2020-02-22 ENCOUNTER — Other Ambulatory Visit: Payer: Self-pay

## 2020-02-22 ENCOUNTER — Ambulatory Visit
Admission: RE | Admit: 2020-02-22 | Discharge: 2020-02-22 | Disposition: A | Discharge status: Home / Self Care | Payer: Medicare PPO | Source: Ambulatory Visit | Attending: Radiation Oncology | Admitting: Radiation Oncology

## 2020-02-22 ENCOUNTER — Encounter: Payer: Self-pay | Admitting: Medical Oncology

## 2020-02-22 ENCOUNTER — Encounter: Payer: Self-pay | Admitting: *Deleted

## 2020-02-22 ENCOUNTER — Inpatient Hospital Stay: Payer: Medicare PPO

## 2020-02-22 ENCOUNTER — Ambulatory Visit: Payer: Medicare PPO | Admitting: Physical Therapy

## 2020-02-22 ENCOUNTER — Encounter: Payer: Self-pay | Admitting: Oncology

## 2020-02-22 ENCOUNTER — Encounter: Payer: Self-pay | Admitting: Radiation Oncology

## 2020-02-22 ENCOUNTER — Inpatient Hospital Stay: Payer: Medicare PPO | Attending: Oncology | Admitting: Oncology

## 2020-02-22 VITALS — BP 129/64 | HR 96 | Temp 98.5°F | Resp 18 | Ht 61.0 in | Wt 175.4 lb

## 2020-02-22 DIAGNOSIS — D0511 Intraductal carcinoma in situ of right breast: Secondary | ICD-10-CM

## 2020-02-22 DIAGNOSIS — Z87891 Personal history of nicotine dependence: Secondary | ICD-10-CM | POA: Diagnosis not present

## 2020-02-22 DIAGNOSIS — Z79899 Other long term (current) drug therapy: Secondary | ICD-10-CM

## 2020-02-22 DIAGNOSIS — Z170 Estrogen receptor positive status [ER+]: Secondary | ICD-10-CM | POA: Diagnosis not present

## 2020-02-22 LAB — CBC WITH DIFFERENTIAL (CANCER CENTER ONLY)
Abs Immature Granulocytes: 0.02 10*3/uL (ref 0.00–0.07)
Basophils Absolute: 0.1 10*3/uL (ref 0.0–0.1)
Basophils Relative: 1 %
Eosinophils Absolute: 0.2 10*3/uL (ref 0.0–0.5)
Eosinophils Relative: 2 %
HCT: 46.8 % — ABNORMAL HIGH (ref 36.0–46.0)
Hemoglobin: 15.1 g/dL — ABNORMAL HIGH (ref 12.0–15.0)
Immature Granulocytes: 0 %
Lymphocytes Relative: 23 %
Lymphs Abs: 1.8 10*3/uL (ref 0.7–4.0)
MCH: 28.8 pg (ref 26.0–34.0)
MCHC: 32.3 g/dL (ref 30.0–36.0)
MCV: 89.1 fL (ref 80.0–100.0)
Monocytes Absolute: 0.7 10*3/uL (ref 0.1–1.0)
Monocytes Relative: 9 %
Neutro Abs: 5.1 10*3/uL (ref 1.7–7.7)
Neutrophils Relative %: 65 %
Platelet Count: 227 10*3/uL (ref 150–400)
RBC: 5.25 MIL/uL — ABNORMAL HIGH (ref 3.87–5.11)
RDW: 16 % — ABNORMAL HIGH (ref 11.5–15.5)
WBC Count: 7.9 10*3/uL (ref 4.0–10.5)
nRBC: 0 % (ref 0.0–0.2)

## 2020-02-22 LAB — CMP (CANCER CENTER ONLY)
ALT: 19 U/L (ref 0–44)
AST: 21 U/L (ref 15–41)
Albumin: 3.8 g/dL (ref 3.5–5.0)
Alkaline Phosphatase: 76 U/L (ref 38–126)
Anion gap: 11 (ref 5–15)
BUN: 18 mg/dL (ref 8–23)
CO2: 24 mmol/L (ref 22–32)
Calcium: 9.4 mg/dL (ref 8.9–10.3)
Chloride: 100 mmol/L (ref 98–111)
Creatinine: 1.49 mg/dL — ABNORMAL HIGH (ref 0.44–1.00)
GFR, Est AFR Am: 38 mL/min — ABNORMAL LOW (ref >60)
GFR, Estimated: 32 mL/min — ABNORMAL LOW (ref >60)
Glucose, Bld: 152 mg/dL — ABNORMAL HIGH (ref 70–99)
Potassium: 4.3 mmol/L (ref 3.5–5.1)
Sodium: 135 mmol/L (ref 135–145)
Total Bilirubin: 0.7 mg/dL (ref 0.3–1.2)
Total Protein: 7.4 g/dL (ref 6.5–8.1)

## 2020-02-22 LAB — GENETIC SCREENING ORDER

## 2020-02-22 MED ORDER — ANASTROZOLE 1 MG PO TABS
1.0000 mg | ORAL_TABLET | Freq: Every day | ORAL | 4 refills | Status: DC
Start: 2020-02-22 — End: 2020-08-01

## 2020-02-22 NOTE — Progress Notes (Signed)
COMET: Referral Patient in clinic for [facility], referred to study by Dr. Jana Hakim. Dr. Jana Hakim referred patient to study and I met with patient and family member during her clinic visit today and we discussed the study. Patient confirms that MD spoke to her about the study and provided her with some information. I spoke with patient more in dept about the study and the randomization process and the two groups in the study. Patient informed me that the surgeon did not recommend surgery for her and also that Dr. Jana Hakim was placing patient on the ET treatment. Patient states that if she doesn't need to have surgery than she would rather not. I informed patient that due to the nature of this study and the randomization process, since she was not planning on having surgery/SOC she would not be eligible for the study. Patient gave her understanding to this. I did provide her with my contact information and the study consent form for information purposes and encouraged her to call me should her treatment status change. Patient and daughter thanked for their time and interest. They both confirm that all their questions were answered to their satisfaction.  Approximately 30 minutes was spent with patient and daughter discussing study, this afternoon.   Maxwell Marion, RN, BSN, [facility] Clinical Research 02/22/2020 4:12 PM

## 2020-02-22 NOTE — Progress Notes (Signed)
Radiation Oncology         [phone] ________________________________  Initial outpatient Consultation  Name: Holly Simpson MRN: [id number]  Date: 02/22/2020  DOB: [birth year]  QU:[id number], Zannie Kehr, MD   REFERRING PHYSICIAN: Jovita Kussmaul, MD  DIAGNOSIS:    ICD-10-CM   1. Ductal carcinoma in situ (DCIS) of right breast  D05.11    Stage 0 Right Breast LIQ DCIS, ER+ / PR+, Grade 2  CHIEF COMPLAINT: Here to discuss management of right breast DCIS  HISTORY OF PRESENT ILLNESS::Holly Simpson is a 84 y.o. female who presented with palpable left breast mass.  Symptoms, if any, at that time, were: tenderness, which had subsided by time of mammography.  The palpable lump subsided before mammography also.  Diagnostic mammogram of breast on 01/26/2020 revealed: indeterminate 2 cm calcifications in right lower medial breast; possible 6 mm mass along anterior aspect of dominant calcifications. Leftl breast was negative. Biopsy on date of 02/13/2020 showed DCIS.  ER status: 100%; PR status 90%; Grade 2.  She is in her usual state of health.  She has been fully vaccinated for Covid.  PREVIOUS RADIATION THERAPY: No  PAST MEDICAL HISTORY:  has a past medical history of Hypertension.    PAST SURGICAL HISTORY:History reviewed. No pertinent surgical history.  FAMILY HISTORY: family history is not on file.  SOCIAL HISTORY:  reports that she has quit smoking. She has never used smokeless tobacco. She reports current alcohol use. She reports that she does not use drugs.  ALLERGIES: Patient has no known allergies.  MEDICATIONS:  Current Outpatient Medications  Medication Sig Dispense Refill  . amLODipine (NORVASC) 5 MG tablet amlodipine 5 mg tablet    . anastrozole (ARIMIDEX) 1 MG tablet Take 1 tablet (1 mg total) by mouth daily. 90 tablet 4  . aspirin EC 81 MG tablet Take 81 mg by mouth daily.    . famotidine (PEPCID) 20 MG tablet famotidine 20 mg tablet    .  furosemide (LASIX) 20 MG tablet     . glipiZIDE (GLUCOTROL XL) 2.5 MG 24 hr tablet Take by mouth.    . levothyroxine (SYNTHROID) 125 MCG tablet     . lisinopril (ZESTRIL) 10 MG tablet TAKE 1 TABLET(10 MG) BY MOUTH EVERY MORNING FOR HIGH BLOOD PRESSURE    . Omega-3 Fatty Acids (FISH OIL) 1000 MG CAPS omega-3 acid ethyl esters 1 gram capsule    . Vitamin D, Ergocalciferol, (DRISDOL) 1.25 MG (50000 UNIT) CAPS capsule      No current facility-administered medications for this encounter.    REVIEW OF SYSTEMS: As above  PHYSICAL EXAM:  vitals were not taken for this visit.   General: Alert and oriented, in no acute distress   ECOG = 1  0 - Asymptomatic (Fully active, able to carry on all predisease activities without restriction)  1 - Symptomatic but completely ambulatory (Restricted in physically strenuous activity but ambulatory and able to carry out work of a light or sedentary nature. For example, light housework, office work)  2 - Symptomatic, <50% in bed during the day (Ambulatory and capable of all self care but unable to carry out any work activities. Up and about more than 50% of waking hours)  3 - Symptomatic, >50% in bed, but not bedbound (Capable of only limited self-care, confined to bed or chair 50% or more of waking hours)  4 - Bedbound (Completely disabled. Cannot carry on any self-care. Totally confined to bed or  chair)  5 - Death   Eustace Pen MM, Creech RH, Tormey DC, et al. [phone]). "Toxicity and response criteria of the [facility]". [place] Oncol. 5 (6): 649-55   LABORATORY DATA:  Lab Results  Component Value Date   WBC 7.9 02/22/2020   HGB 15.1 (H) 02/22/2020   HCT 46.8 (H) 02/22/2020   MCV 89.1 02/22/2020   PLT 227 02/22/2020   CMP     Component Value Date/Time   NA 135 02/22/2020 1221   K 4.3 02/22/2020 1221   CL 100 02/22/2020 1221   CO2 24 02/22/2020 1221   GLUCOSE 152 (H) 02/22/2020 1221   BUN 18 02/22/2020 1221   CREATININE  1.49 (H) 02/22/2020 1221   CALCIUM 9.4 02/22/2020 1221   PROT 7.4 02/22/2020 1221   ALBUMIN 3.8 02/22/2020 1221   AST 21 02/22/2020 1221   ALT 19 02/22/2020 1221   ALKPHOS 76 02/22/2020 1221   BILITOT 0.7 02/22/2020 1221   GFRNONAA 32 (L) 02/22/2020 1221   GFRAA 38 (L) 02/22/2020 1221       RADIOGRAPHY:    As above    IMPRESSION/PLAN: This is a delightful 84 year old woman with DCIS of the right breast  She has spoken with our medical oncologist and surgeon and is enthusiastic about enrolling on the COMET trial.  She plans to take an antiestrogen medication on this trial.  She will not undergo surgical resection up front.  There is no role for radiation therapy at this time.  I will see her back on an as-needed basis.  I wished her the very best.  On date of service, in total, I spent 30 minutes on this encounter.  Patient was seen in person.   __________________________________________   Eppie Gibson, MD   This document serves as a record of services personally performed by Eppie Gibson, MD. It was created on her behalf by Wilburn Mylar, a trained medical scribe. The creation of this record is based on the scribe's personal observations and the provider's statements to them. This document has been checked and approved by the attending provider.

## 2020-02-24 ENCOUNTER — Telehealth: Payer: Self-pay | Admitting: Oncology

## 2020-02-24 NOTE — Telephone Encounter (Signed)
Scheduled appts per 5/19 LOS. Left voicemail with appt date and time.

## 2020-02-29 ENCOUNTER — Encounter: Payer: Self-pay | Admitting: General Practice

## 2020-02-29 NOTE — Progress Notes (Signed)
Nutrition  Patient identified by attending Breast Clinic on 02/15/20.    84 year old female with DCIS of left breast.  Patient declined surgery and radiation. Planning antiestrogens and screening.    Chart reviewed.  Ht 61 inches Wt 175 lb 6.4 oz BMI: 33, stable weight  Patient was provided nutrition packet at clinic on 5/12 with RD contact information.  Patient currently not at nutritional risk. RD available if needed  Holly Simpson, [place], [place] Registered Dietitian [phone] (pager)

## 2020-02-29 NOTE — Progress Notes (Signed)
[place] Psychosocial Distress Screening Spiritual Care  Attempted to reach [facility] by phone following Breast Multidisciplinary Clinic to introduce [place] team/resources, reviewing distress screen per protocol.  The patient scored a 1 on the Psychosocial Distress Thermometer which indicates mild distress.   ONCBCN DISTRESS SCREENING 02/29/2020  Screening Type Initial Screening  Distress experienced in past week (1-10) 1  Physical Problem type Sleep/insomnia  Referral to support programs Yes    Follow up needed: No. Ms Bartie received full packet of [place] team/programming information at [facility]. Left voicemail encouraging callback, but please also page if needs arise or circumstances change. Thank you.   [place], [place], [facility] Pager [phone] Voicemail [phone]

## 2020-03-01 ENCOUNTER — Other Ambulatory Visit: Payer: Self-pay | Admitting: *Deleted

## 2020-03-01 ENCOUNTER — Encounter: Payer: Self-pay | Admitting: *Deleted

## 2020-03-01 ENCOUNTER — Telehealth: Payer: Self-pay | Admitting: *Deleted

## 2020-03-01 DIAGNOSIS — D0511 Intraductal carcinoma in situ of right breast: Secondary | ICD-10-CM

## 2020-03-01 NOTE — Telephone Encounter (Signed)
Spoke to pt concerning [place] from 5.19.21 Denies questions or concerns regarding dx or treatment care plan. Pt has decided to have "active watching" with AI only. Physician team aware. Pt with f/u with Dr. Jana Hakim on 7/22. Pt tolerating anastrozole well. No side effects noted at this time. Encourage pt to call with needs or further questions. Received verbal understanding. Contact information provided.

## 2020-03-20 ENCOUNTER — Telehealth: Payer: Self-pay | Admitting: Physical Medicine and Rehabilitation

## 2020-03-20 NOTE — Telephone Encounter (Signed)
Called pt and lvm #1 

## 2020-03-20 NOTE — Telephone Encounter (Signed)
Pt called stating she would like to get an appt scheduled to look t her hips.   [phone]

## 2020-03-22 ENCOUNTER — Telehealth: Payer: Self-pay

## 2020-03-22 NOTE — Telephone Encounter (Signed)
Ok, can up to 3 per year, may need consult with Dr. Ninfa Linden

## 2020-03-22 NOTE — Telephone Encounter (Signed)
Called pt and lvm #1 

## 2020-03-22 NOTE — Telephone Encounter (Signed)
Patient called in wanting to sch an appt, says the best timeframe to reach her is before 12:00pm

## 2020-03-22 NOTE — Telephone Encounter (Signed)
Pt would like to have a repeat for Left and Right hip inj. OK to repeat? If no new injuries/traumas? Please advise.

## 2020-03-26 ENCOUNTER — Telehealth: Payer: Self-pay | Admitting: *Deleted

## 2020-03-26 NOTE — Telephone Encounter (Signed)
VM left by the patient stating she is having " the worst pain in my stomach " - " I was started on the anastrazole and was wondering if that is what is causing it ?" " I read on my sheet it can cause stomach problems " " also my stools are yellow "   This RN returned call to [phone] and obtained VM.  Message left requesting a return call but also to inform her the above is not a known or common side effect of the anastrazole. ( regarding the pain - and yellow stools ).  This RN's name given for return call.

## 2020-03-27 ENCOUNTER — Telehealth: Payer: Self-pay | Admitting: *Deleted

## 2020-03-27 NOTE — Telephone Encounter (Signed)
VM left by the pt per call from yesterday - Holly Simpson states she continued with abd discomfort that she proceeded to go to the ER.  " they couldn't find anything wrong and that it was gas " " Gas! I have never had abd pain like that before "  She states she was prescribed hycosamine and ondansetron.  " I guess it is ok if I take these medications but wanted to let you know ".  This RN returned call and obtained pt's answering machine- message left informing pt above medications ok to take with the anastrazole as well as request for a return call to this RN.

## 2020-03-29 ENCOUNTER — Telehealth: Payer: Self-pay | Admitting: Physical Medicine and Rehabilitation

## 2020-03-29 NOTE — Telephone Encounter (Signed)
Called pt and mailbox is full, unable to lvm.

## 2020-03-29 NOTE — Telephone Encounter (Signed)
Scheduled for 6/30 at 1315.

## 2020-03-29 NOTE — Telephone Encounter (Signed)
As per prior phone messages on 6/15-17, ok to repeat up to 3 times per year - may need consult for hip replace FYI

## 2020-03-29 NOTE — Telephone Encounter (Signed)
Pt would like to have a repeat left hip injection, pt had injection 12/15/19. If no new traumas/injuries ok to repeat?

## 2020-03-29 NOTE — Telephone Encounter (Signed)
See previous message

## 2020-03-29 NOTE — Telephone Encounter (Signed)
Patient called. Returning a phone call to get set up for appointment. Her call back number is [phone]

## 2020-03-29 NOTE — Telephone Encounter (Signed)
Patient called requesting a cortisone injection in left hip. Patient states left hip still in pain. Please call patient to set appointment. Patient cell phone number is [phone].

## 2020-04-04 ENCOUNTER — Other Ambulatory Visit: Payer: Self-pay

## 2020-04-04 ENCOUNTER — Ambulatory Visit (INDEPENDENT_AMBULATORY_CARE_PROVIDER_SITE_OTHER): Payer: Medicare PPO | Admitting: Physical Medicine and Rehabilitation

## 2020-04-04 ENCOUNTER — Ambulatory Visit: Payer: Self-pay

## 2020-04-04 ENCOUNTER — Encounter: Payer: Self-pay | Admitting: Physical Medicine and Rehabilitation

## 2020-04-04 DIAGNOSIS — M25552 Pain in left hip: Secondary | ICD-10-CM | POA: Diagnosis not present

## 2020-04-04 MED ORDER — TRIAMCINOLONE ACETONIDE 40 MG/ML IJ SUSP
60.0000 mg | INTRAMUSCULAR | Status: AC | PRN
  Administered 2020-04-04: 60 mg via INTRA_ARTICULAR

## 2020-04-04 MED ORDER — BUPIVACAINE HCL 0.25 % IJ SOLN
4.0000 mL | INTRAMUSCULAR | Status: AC | PRN
  Administered 2020-04-04: 4 mL via INTRA_ARTICULAR

## 2020-04-04 NOTE — Progress Notes (Signed)
Pt states pain in the left hip (groin pain). Pt states last injection 12/15/19 helped out a lot and lasted until about 3 weeks ago. Pt states going up steps, walking, and getting in and out of a car makes pain worse. Sitting and relaxing helps with pain.   .Numeric Pain Rating Scale and Functional Assessment Average Pain 6   In the last MONTH (on 0-10 scale) has pain interfered with the following?  1. General activity like being  able to carry out your everyday physical activities such as walking, climbing stairs, carrying groceries, or moving a chair?  Rating(6)    -Dye Allergies.

## 2020-04-04 NOTE — Progress Notes (Signed)
Holly Simpson - 84 y.o. Holly MRN [id number]  Date of birth: [birth year]  Office Visit Note: Visit Date: 04/04/2020 PCP: Holly Larsson, PA-C Referred by: Holly Larsson, PA-C  Subjective: Chief Complaint  Patient presents with   Left Hip - Pain   HPI:  Holly Simpson is a 84 y.o. Holly who comes in today at the request of Holly Simpson for planned Left anesthetic hip arthrogram with fluoroscopic guidance.  The patient has failed conservative care including home exercise, medications, time and activity modification.  This injection will be diagnostic and hopefully therapeutic.  Please see requesting physician notes for further details and justification.  ROS Otherwise per HPI.  Assessment & Plan: Visit Diagnoses:  1. Pain in left hip     Plan: No additional findings.   Meds & Orders: No orders of the defined types were placed in this encounter.   Orders Placed This Encounter  Procedures   XR C-ARM NO REPORT    Follow-up: No follow-ups on file.   Procedures: Large Joint Inj: L hip joint on 04/04/2020 1:24 PM Indications: diagnostic evaluation and pain Details: 22 G 3.5 in needle, fluoroscopy-guided anterior approach  Arthrogram: No  Medications: 4 mL bupivacaine 0.25 %; 60 mg triamcinolone acetonide 40 MG/ML Outcome: tolerated well, no immediate complications  There was excellent flow of contrast producing a partial arthrogram of the hip. The patient did have relief of symptoms during the anesthetic phase of the injection. Procedure, treatment alternatives, risks and benefits explained, specific risks discussed. Consent was given by the patient. Immediately prior to procedure a time out was called to verify the correct patient, procedure, equipment, support staff and site/side marked as required. Patient was prepped and draped in the usual sterile fashion.      No notes on file   Clinical History: EDX Right upper  Test Date: 08/19/2017    Patient: Holly Simpson DOB: [birth year] Physician: Ardean Larsen, M.D.  Sex: Holly Height: 5\' 0"  Ref Phys: Addison Naegeli, M.D.  ID#: [id number]     Patient Complaints: Right hand numbness    Patient History / Exam: 84 year old right handed woman presents  for a study to evaluate right hand numbness. On exam she has a  positive Tinel's sign at right carpal tunnel, decreased sharp  sensation right thumb and index finger and weakness of right  thumb abduction.    NCV & EMG Findings:  Evaluation of the right median motor nerve showed reduced  amplitude (0.4 mV) and decreased conduction velocity  (Elbow-Wrist, 44 m/s). The right median sensory nerve showed no  response (Palm) and no response (2nd Digit). All remaining  nerves (as indicated in the following tables) were within normal  limits.    Impression: This is an abnormal study. There is  electrophysiological evidence of a right median mononeuropathy at  the wrist compatible with severe right carpal tunnel syndrome.   Recommendations: Patient will be referred to Orthopedic Hand  surgery for right carpal tunnel release. She was instructed to  wear a right carpal tunnel splint as much as possible until then.    _____________________________  Ardean Larsen, M.D.  Board Certified in Neurology  Board Certified in [facility] ---[facility], 09/17/2016 1:06 PM   INDICATION: LOW BACK PAIN, >6 WKS / RED FLAG(S) / RADICULOPATHY \ \ M54.5 Spine pain, lumbar \ Z98.1 S/P lumbar fusion \ R20.0 Numbness and tingling of both lower extremities \ R20.2 Numbness  and tingling of both lower extremities   COMPARISON: Multiple, most recent: Lumbar spine radiograph dated 08/12/2016. Reference CT abdomen 03/17/2011.   TECHNIQUE: Multiplanar, multi-sequence surface-coil MR imaging of the lumbar spine was performed without contrast.   LEVELS IMAGED: Lower thoracic to upper sacral  region.   FINDINGS:  Alignment: Similar grade 1 L4 on L5 anterolisthesis. Mild dextroconvex curvature of the lumbar spine. Vertebrae: Surgical changes from L4-L5 PLIF. No marrow signal abnormalities to suggest neoplasm. Conus: In normal position without imaging evidence for tethering. Normal signal and contour.  T12-L1: No significant focal abnormality. L1-L2: No significant focal abnormality. L2-L3: Disc bulge, facet hypertrophy, and ligamentum flavum thickening result in mild canal narrowing, severe left foraminal narrowing, and mild left foraminal narrowing. L3-L4: Disc bulge and facet hypertrophy with mild canal narrowing, moderate to severe left foraminal narrowing, and at least moderate right foraminal narrowing. L4-L5: Grade 1 anterolisthesis with disc uncovering. Mild canal narrowing. L5-S1: Small posterior disc bulge and mild bilateral foraminal narrowing without significant canal or foraminal stenosis. Upper sacrum/ilium: No significant focal abnormality.   CONCLUSION:  1.  Degenerative changes of the lumbar spine, most pronounced at L2-L3 and L3-L4. There is mild canal narrowing, severe left foraminal narrowing, and mild left foraminal narrowing at L2-L3. At L3-L4, there is mild canal narrowing, moderate to severe left foraminal narrowing, and at least moderate right foraminal narrowing. 2.  Postsurgical changes from L4-L5 PLIF.     Objective:  VS:  HT:     WT:    BMI:      BP:    HR: bpm   TEMP: ( )   RESP:  Physical Exam Constitutional:      General: She is not in acute distress.    Appearance: Normal appearance. She is not ill-appearing.  HENT:     Head: Normocephalic and atraumatic.     Right Ear: External ear normal.     Left Ear: External ear normal.  Eyes:     Extraocular Movements: Extraocular movements intact.  Cardiovascular:     Rate and Rhythm: Normal rate.     Pulses: Normal pulses.  Musculoskeletal:     Right lower leg: No edema.     Left lower leg:  No edema.     Comments: Patient has good distal strength with no pain over the greater trochanters.  No clonus or focal weakness. Painful ROM with internal rotation on the left  Skin:    Findings: No erythema, lesion or rash.  Neurological:     General: No focal deficit present.     Mental Status: She is alert and oriented to person, place, and time.     Sensory: No sensory deficit.     Motor: No weakness or abnormal muscle tone.     Coordination: Coordination normal.  Psychiatric:        Mood and Affect: Mood normal.        Behavior: Behavior normal.      Imaging: No results found.

## 2020-04-26 ENCOUNTER — Inpatient Hospital Stay: Payer: Medicare PPO | Attending: Oncology | Admitting: Oncology

## 2020-04-26 ENCOUNTER — Other Ambulatory Visit: Payer: Self-pay

## 2020-04-26 ENCOUNTER — Encounter: Payer: Self-pay | Admitting: *Deleted

## 2020-04-26 VITALS — BP 151/62 | HR 67 | Temp 98.2°F | Resp 18 | Ht 61.0 in | Wt 177.7 lb

## 2020-04-26 DIAGNOSIS — N6320 Unspecified lump in the left breast, unspecified quadrant: Secondary | ICD-10-CM | POA: Diagnosis not present

## 2020-04-26 DIAGNOSIS — Z87891 Personal history of nicotine dependence: Secondary | ICD-10-CM | POA: Insufficient documentation

## 2020-04-26 DIAGNOSIS — Z79811 Long term (current) use of aromatase inhibitors: Secondary | ICD-10-CM | POA: Diagnosis not present

## 2020-04-26 DIAGNOSIS — Z79899 Other long term (current) drug therapy: Secondary | ICD-10-CM | POA: Insufficient documentation

## 2020-04-26 DIAGNOSIS — Z85828 Personal history of other malignant neoplasm of skin: Secondary | ICD-10-CM | POA: Diagnosis not present

## 2020-04-26 DIAGNOSIS — Z170 Estrogen receptor positive status [ER+]: Secondary | ICD-10-CM | POA: Insufficient documentation

## 2020-04-26 DIAGNOSIS — D0511 Intraductal carcinoma in situ of right breast: Secondary | ICD-10-CM | POA: Insufficient documentation

## 2020-04-26 NOTE — Progress Notes (Signed)
[facility]  Telephone:([phone]) [phone] Fax:(336) [phone]     ID: Holly Simpson DOB: 1936-04-04  MR#: [id number]  MGQ#:[id number]  Patient Care Team: [name] as PCP - General (Physician Assistant) Mauro Kaufmann, RN as Oncology Nurse Navigator Rockwell Germany, RN as Oncology Nurse Navigator Rolm Bookbinder, MD as Consulting Physician (General Surgery) Alek Poncedeleon, Virgie Dad, MD as Consulting Physician (Oncology) Eppie Gibson, MD as Attending Physician (Radiation Oncology) Magnus Sinning, MD as Consulting Physician (Physical Medicine and Rehabilitation) Ria Clock, MD as Attending Physician (Radiology) Chauncey Cruel, MD OTHER MD:  CHIEF COMPLAINT: estrogen receptor positive noninvasive breast cancer  CURRENT TREATMENT: Anastrozole; intensified screening   INTERVAL HISTORY: Holly Simpson returns today for follow up of her noninvasive breast cancer. She was evaluated in the multidisciplinary breast cancer clinic on 02/22/2020.  She is accompanied by her sister who has a history of breast cancer herself.  Holly Simpson opted to bypass surgery and radiation and start on anastrozole.  Her last right breast ultrasound was 01/26/2020.   REVIEW OF SYSTEMS: Holly Simpson is tolerating anastrozole quite well.  She has no arthralgias or myalgias.  She thinks her breasts are little less fluffy than before she does have occasional mastalgia bilaterally.  She does not exercise regularly because her left hip hurts and she has back pain but she does mow her yard and gets on her more all the time she says.  She does her housework shopping and cooking.  A detailed review of systems today was otherwise stable.   HISTORY OF CURRENT ILLNESS: From the original intake note:  "Holly Simpson" herself palpated a left breast lump, as well as noted associated tenderness. She underwent bilateral diagnostic mammography with tomography and right breast ultrasonography at [facility] on  01/26/2020 showing: breast density category C; indeterminate 2 cm calcifications in linear distribution within right lower medial breast; possible 6 mm mass along anterior aspect of right lower medial calcifications; no abnormalities were seen sonographically in the right breast or axilla.  Accordingly on 02/13/2020 she proceeded to biopsy of the right breast calcifications in question. The pathology from this procedure ([id number]) showed: ductal carcinoma in situ, intermediate grade. Prognostic indicators significant for: estrogen receptor, 100% positive and progesterone receptor, 90% positive, both with strong staining intensity.  The patient's subsequent history is as detailed below.   PAST MEDICAL HISTORY: Past Medical History:  Diagnosis Date  . Hypertension     PAST SURGICAL HISTORY: No past surgical history on file.  She has a history of skin cancer removal from her right ankle and left ear.   FAMILY HISTORY: No family history on file.  Her father died at age 25 and her mother at age 56, both from heart issues. She has one brother and one sister. There is no family history of cancer to her knowledge.   GYNECOLOGIC HISTORY:  No LMP recorded. Menarche: [age] years old [place] P 0 LMP around age 61 Contraceptive: never used HRT: never used  Hysterectomy? no BSO? 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  She is single lives by herself, with no pets.    ADVANCED DIRECTIVES: The patient has named Jana Half June Durene Romans as her healthcare power of attorney.  She can be reached at [phone].   HEALTH MAINTENANCE: Social History   Tobacco Use  . Smoking status: Former Research scientist (life sciences)  . Smokeless tobacco: Never Used  Substance Use Topics  . Alcohol use:  Yes  . Drug use: Never     Colonoscopy: date unsure, repeat not indicated  PAP: "years" ago  Bone density: date unsure   No Known Allergies  Current  Outpatient Medications  Medication Sig Dispense Refill  . amLODipine (NORVASC) 5 MG tablet amlodipine 5 mg tablet    . anastrozole (ARIMIDEX) 1 MG tablet Take 1 tablet (1 mg total) by mouth daily. 90 tablet 4  . aspirin EC 81 MG tablet Take 81 mg by mouth daily.    . famotidine (PEPCID) 20 MG tablet famotidine 20 mg tablet    . furosemide (LASIX) 20 MG tablet     . glipiZIDE (GLUCOTROL XL) 2.5 MG 24 hr tablet Take by mouth.    . levothyroxine (SYNTHROID) 125 MCG tablet     . lisinopril (ZESTRIL) 10 MG tablet TAKE 1 TABLET(10 MG) BY MOUTH EVERY MORNING FOR HIGH BLOOD PRESSURE    . Omega-3 Fatty Acids (FISH OIL) 1000 MG CAPS omega-3 acid ethyl esters 1 gram capsule    . Vitamin D, Ergocalciferol, (DRISDOL) 1.25 MG (50000 UNIT) CAPS capsule      No current facility-administered medications for this visit.    OBJECTIVE: White woman who appears stated age  41:   04/26/20 1432  BP: (!) 151/62  Pulse: 67  Resp: 18  Temp: 98.2 F (36.8 C)  SpO2: 97%     Body mass index is 33.58 kg/m.   Wt Readings from Last 3 Encounters:  04/26/20 177 lb 11.2 oz (80.6 kg)  02/22/20 175 lb 6.4 oz (79.6 kg)  11/30/19 180 lb (81.6 kg)      ECOG FS:1 - Symptomatic but completely ambulatory  Sclerae unicteric, EOMs intact Wearing a mask No cervical or supraclavicular adenopathy Lungs no rales or rhonchi Heart regular rate and rhythm Abd soft, nontender, positive bowel sounds MSK no focal spinal tenderness, no upper extremity lymphedema Neuro: nonfocal, well oriented, appropriate affect Breasts: I do not palpate any mass in the right breast.  There are no skin or nipple changes of concern.  The left breast is benign.  Both axillae are benign.   LAB RESULTS:  CMP     Component Value Date/Time   NA 135 02/22/2020 1221   K 4.3 02/22/2020 1221   CL 100 02/22/2020 1221   CO2 24 02/22/2020 1221   GLUCOSE 152 (H) 02/22/2020 1221   BUN 18 02/22/2020 1221   CREATININE 1.49 (H) 02/22/2020 1221     CALCIUM 9.4 02/22/2020 1221   PROT 7.4 02/22/2020 1221   ALBUMIN 3.8 02/22/2020 1221   AST 21 02/22/2020 1221   ALT 19 02/22/2020 1221   ALKPHOS 76 02/22/2020 1221   BILITOT 0.7 02/22/2020 1221   GFRNONAA 32 (L) 02/22/2020 1221   GFRAA 38 (L) 02/22/2020 1221    No results found for: TOTALPROTELP, ALBUMINELP, A1GS, A2GS, BETS, BETA2SER, GAMS, MSPIKE, SPEI  Lab Results  Component Value Date   WBC 7.9 02/22/2020   NEUTROABS 5.1 02/22/2020   HGB 15.1 (H) 02/22/2020   HCT 46.8 (H) 02/22/2020   MCV 89.1 02/22/2020   PLT 227 02/22/2020    No results found for: LABCA2  No components found for: FXTKWI097  No results for input(s): INR in the last 168 hours.  No results found for: LABCA2  No results found for: DZH299  No results found for: MEQ683  No results found for: MHD622  No results found for: CA2729  No components found for: HGQUANT  No results found for: CEA1 /  No results found for: CEA1   No results found for: AFPTUMOR  No results found for: CHROMOGRNA  No results found for: KPAFRELGTCHN, LAMBDASER, KAPLAMBRATIO (kappa/lambda light chains)  No results found for: HGBA, HGBA2QUANT, HGBFQUANT, HGBSQUAN (Hemoglobinopathy evaluation)   No results found for: LDH  No results found for: IRON, TIBC, IRONPCTSAT (Iron and TIBC)  No results found for: FERRITIN  Urinalysis No results found for: COLORURINE, APPEARANCEUR, LABSPEC, PHURINE, GLUCOSEU, HGBUR, BILIRUBINUR, KETONESUR, PROTEINUR, UROBILINOGEN, NITRITE, LEUKOCYTESUR   STUDIES: XR C-ARM NO REPORT  Result Date: 04/04/2020 Please see Notes tab for imaging impression.    ELIGIBLE FOR AVAILABLE RESEARCH PROTOCOL: Decided against COMET trial  ASSESSMENT: 84 y.o. [place], [place] woman status post left breast biopsy 02/13/2020 for ductal carcinoma in situ, grade 2, strongly estrogen and progesterone receptor positive  (a) the patient originally palpated a mass, subsequent ultrasound did not document a  mass  (1) patient opted for intensified screening with no surgery or radiation (and opted against COMET)  (2) anastrozole started 02/22/2020  PLAN: Holly Simpson is now 3 months out from definitive diagnosis of breast cancer.  She has refused surgery and radiation and is being followed with observation under treatment with anastrozole..  Clinically there is no evidence of disease.  The question is how best to follow her.  I would like to do an abbreviated MRI in October and then repeat her mammography and ultrasonography in April.  I have asked her to call us if the cost of the MRI is going to be more than $500 and in that case we will think of a different plan.  All this was given to her in writing today.  She will see me late October and she knows to call for any other issue that may develop before the next visit.  Total encounter time 25 minutes.Sarajane Jews C. Natassja Ollis, MD 04/26/2020 2:46 PM Medical Oncology and Hematology [facility] [place], [place] [address] Tel. [phone]    Fax. [phone]   This document serves as a record of services personally performed by Lurline Del, MD. It was created on his behalf by Wilburn Mylar, a trained medical scribe. The creation of this record is based on the scribe's personal observations and the provider's statements to them.   I, Lurline Del MD, have reviewed the above documentation for accuracy and completeness, and I agree with the above.    *Total Encounter Time as defined by the Centers for Medicare and Medicaid Services includes, in addition to the face-to-face time of a patient visit (documented in the note above) non-face-to-face time: obtaining and reviewing outside history, ordering and reviewing medications, tests or procedures, care coordination (communications with other health care professionals or caregivers) and documentation in the medical record.

## 2020-07-09 ENCOUNTER — Other Ambulatory Visit: Payer: Self-pay

## 2020-07-09 ENCOUNTER — Ambulatory Visit
Admission: RE | Admit: 2020-07-09 | Discharge: 2020-07-09 | Disposition: A | Discharge status: Home / Self Care | Payer: Medicare PPO | Source: Ambulatory Visit | Attending: Oncology | Admitting: Oncology

## 2020-07-09 ENCOUNTER — Other Ambulatory Visit: Payer: Self-pay | Admitting: Oncology

## 2020-07-09 DIAGNOSIS — M795 Residual foreign body in soft tissue: Secondary | ICD-10-CM

## 2020-07-09 DIAGNOSIS — D0511 Intraductal carcinoma in situ of right breast: Secondary | ICD-10-CM

## 2020-07-09 IMAGING — MR MR BREAST WO/W CM  BILAT
8 of 12 series · 32 of 48 positions shown · IV contrast (GADAVIST)
Comparison: No previous breast MRI. Comparison is made to an
outside bilateral diagnostic mammogram dated [DATE].

CLINICAL DATA: History of RIGHT breast DCIS diagnosed by
stereotactic biopsy for calcifications on [DATE]. Patient has
elected for observation under treatment with anastrozole.
TECHNIQUE: Multiplanar, multisequence MR images of both breasts were obtained
prior to and following the intravenous administration of 8 ml of
Gadavist

[Series 2: t2_tirm_tra ipat (a-p) · axial · 3.0mm · 0.78mm/px · 1 of 60 slices shown]
[im 1/60]
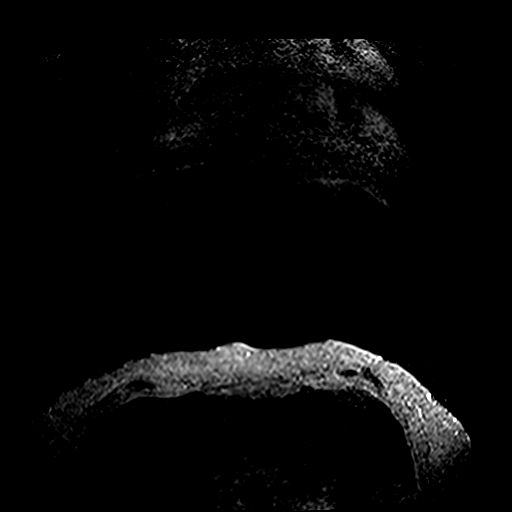

[Series 3: fl3d pre-cm no · axial · non-contrast · 1.2mm · 1.04mm/px · z∈[-115,+95]mm · 5 of 176 slices shown]
[im 1/176]
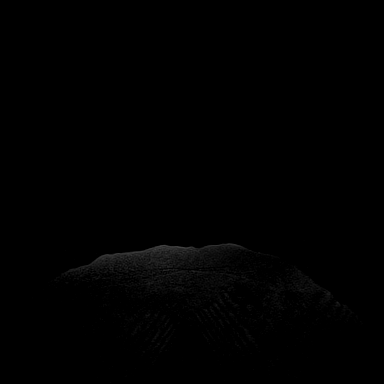
[im 44/176]
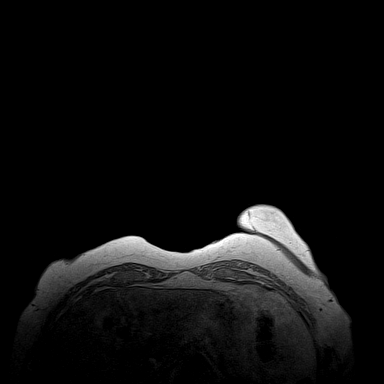
[im 88/176]
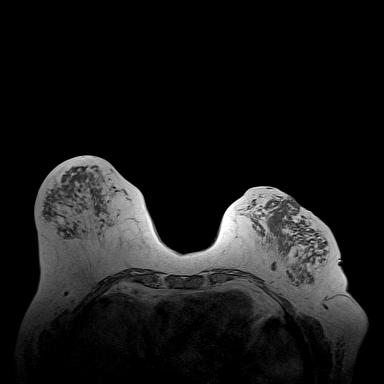
[im 132/176]
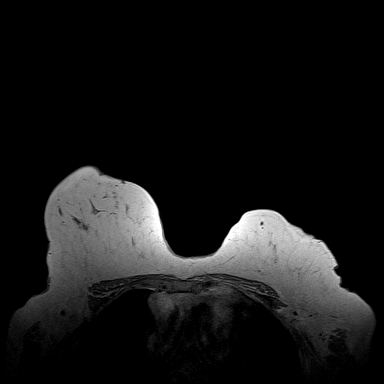
[im 176/176]
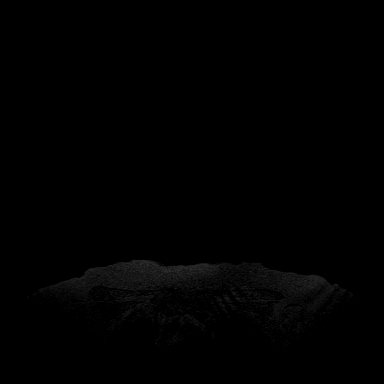

[Series 4: fl3d pre-cm · axial · non-contrast · 1.2mm · 1.04mm/px · z∈[-115,+95]mm · 5 of 176 slices shown]
[im 1/176]
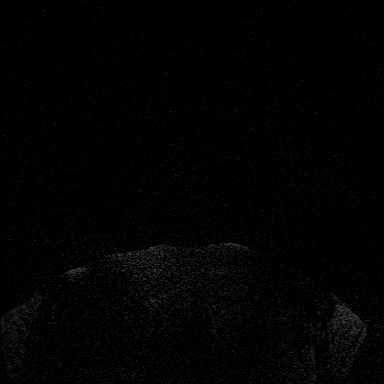
[im 44/176]
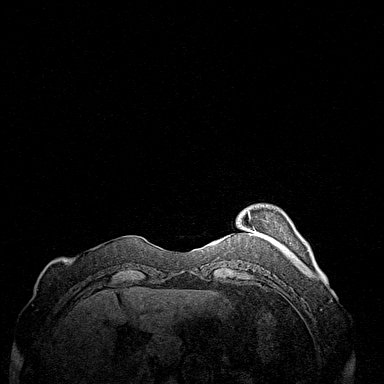
[im 88/176]
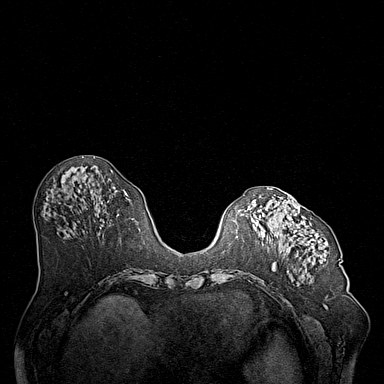
[im 132/176]
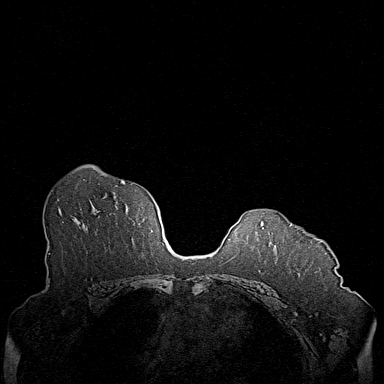
[im 176/176]
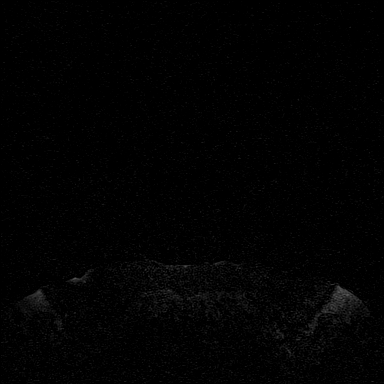

[Series 5: fl3d post-cm 20 · axial · 1.2mm · 1.04mm/px · z∈[-115,+95]mm · 5 of 176 slices shown (1 of 3)]
[im 1/176]
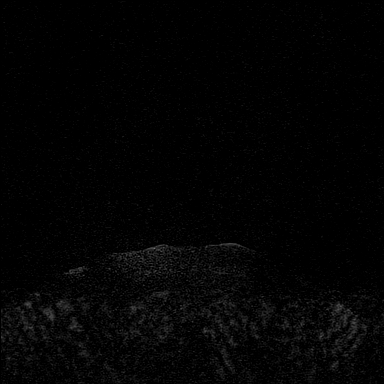
[im 44/176]
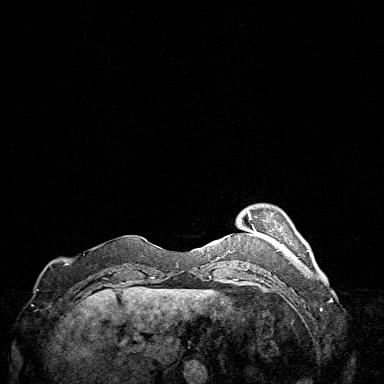
[im 88/176]
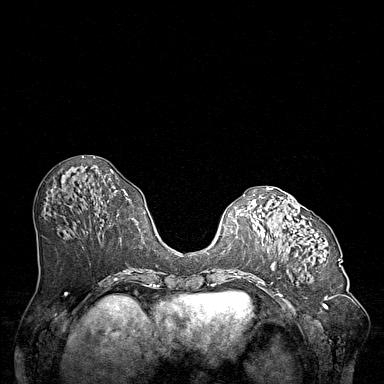
[im 132/176]
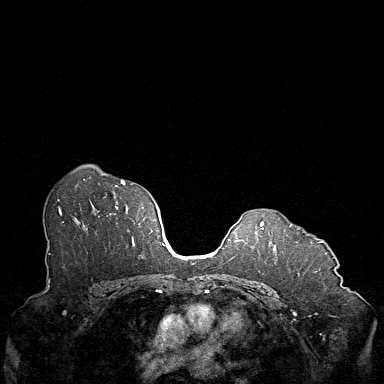
[im 176/176]
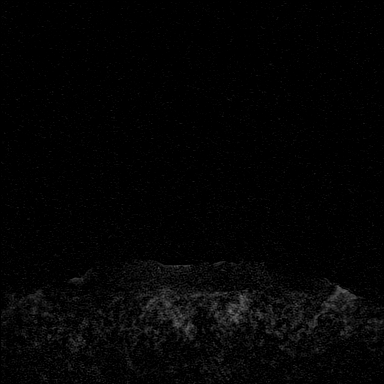

[Series 6: fl3d post-cm 20 · axial · 1.2mm · 1.04mm/px · z∈[-115,+95]mm · 5 of 176 slices shown (2 of 3)]
[im 1/176]
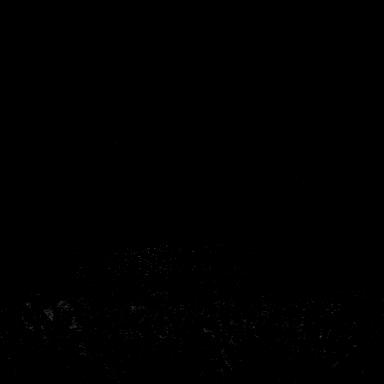
[im 44/176]
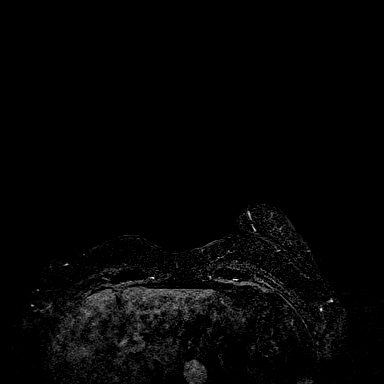
[im 88/176]
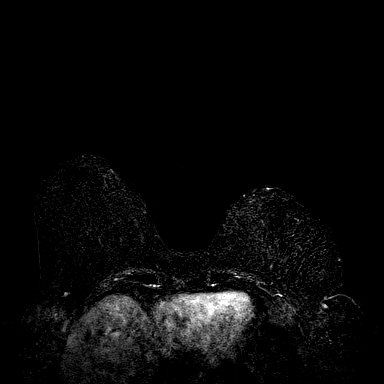
[im 132/176]
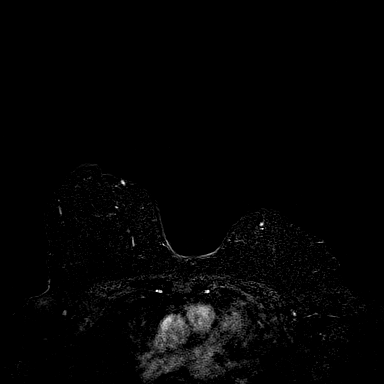
[im 176/176]
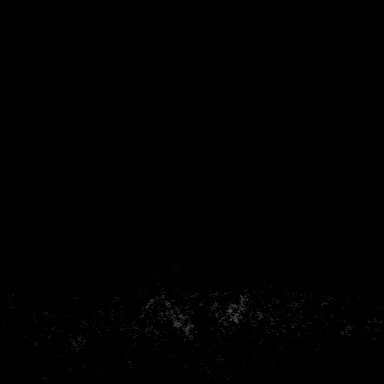

[Series 7: fl3d post-cm 20 · axial · 211.2mm · 1.04mm/px · 1 of 1 slices shown (3 of 3)]
[im 1/1]
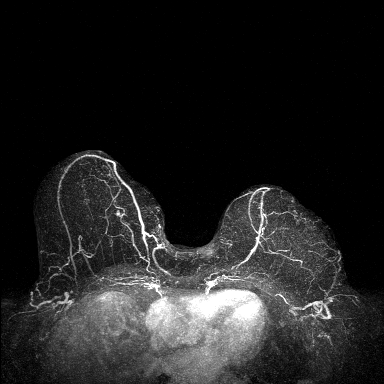

[Series 8: fl3d post-cm 3min · axial · 1.2mm · 1.04mm/px · z∈[-115,+95]mm · 6 of 176 slices shown]
[im 1/176]
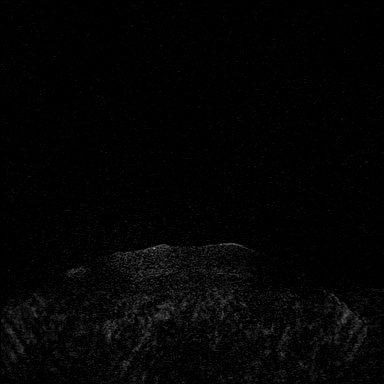
[im 36/176]
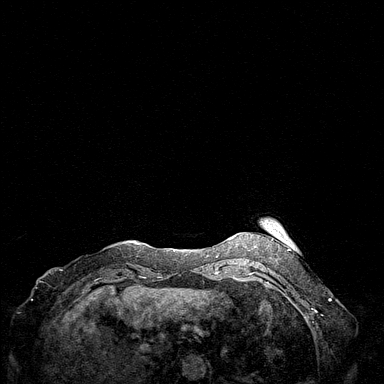
[im 71/176]
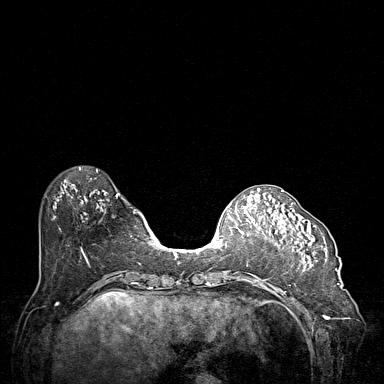
[im 106/176]
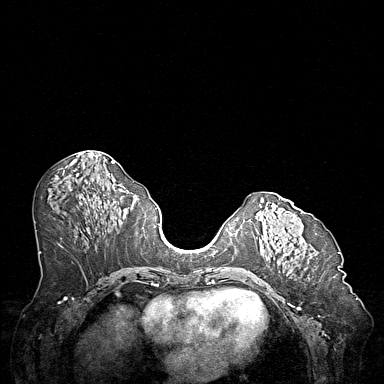
[im 141/176]
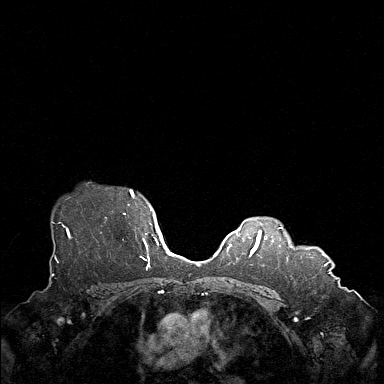
[im 176/176]
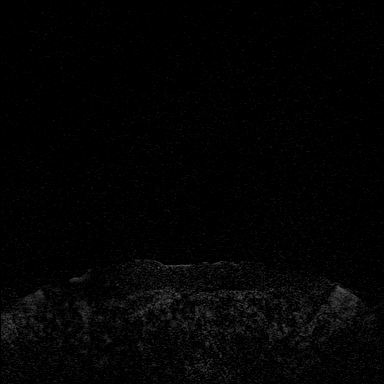

[Series 9: fl3d post-cm 3min_sub · axial · 1.2mm · 1.04mm/px · z∈[-115,+11]mm · 4 of 176 slices shown]
[im 1/176]
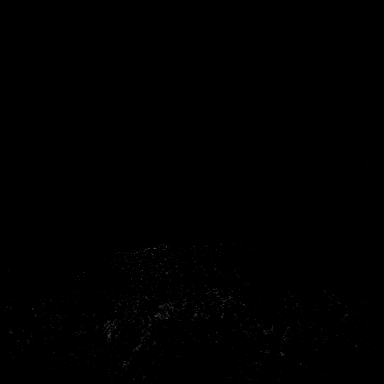
[im 36/176]
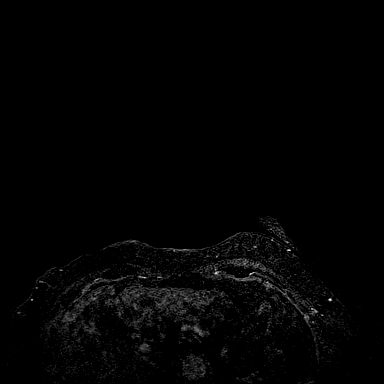
[im 71/176]
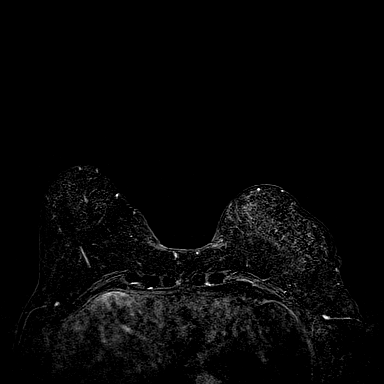
[im 106/176]
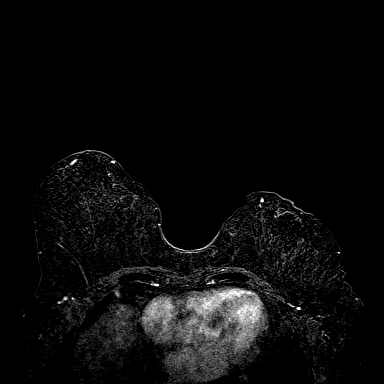

[32 of 48 positions shown; findings below may reference images not displayed]

Ordering physician requested an abbreviated breast MRI. A full
screening breast MRI was performed today, however, per the MRI
technologist, the charge for today's study will be concordant with
the requested abbreviated breast MRI.

LABS:  Not applicable

EXAM:
BILATERAL BREAST MRI WITH AND WITHOUT CONTRAST
Three-dimensional MR images were rendered by post-processing of the
original MR data on an independent workstation. The
three-dimensional MR images were interpreted, and findings are
reported in the following complete MRI report for this study. Three
dimensional images were evaluated at the independent interpreting
workstation using the DynaCAD thin client.
FINDINGS: Breast composition: c. Heterogeneous fibroglandular tissue.

Background parenchymal enhancement: Moderate.

Right breast: Enhancing mass within the lower inner quadrant of the
RIGHT breast, at middle depth, measuring 9 mm, with mixed washout,
persistent and plateau enhancement kinetics, with adjacent biopsy
clip artifact, corresponding to patient's known RIGHT breast DCIS.

No additional suspicious enhancing mass, non-mass enhancement or
secondary signs of malignancy within the RIGHT breast.

Left breast: No suspicious enhancing mass, non-mass enhancement or
secondary signs of malignancy within the LEFT breast.

Lymph nodes: No abnormal appearing lymph nodes.

Ancillary findings:  None.
IMPRESSION: 1. Known biopsy-proven DCIS within the lower inner quadrant of the
RIGHT breast, with an associated 9 mm enhancing mass, with
associated biopsy clip. If patient continues to elect for
nonsurgical treatment, recommend additional follow-up breast MRI in
6-12 months to ensure stability of today's appearance.
2. No evidence of malignancy within the LEFT breast.

RECOMMENDATION:
Per treatment plan for patient's known RIGHT breast cancer. As
above, if patient continues to elect for nonsurgical treatment,
would consider follow-up breast MRI in 6-12 months to ensure
stability of today's appearance.

BI-RADS CATEGORY  6: Known biopsy-proven malignancy.

## 2020-07-09 IMAGING — CR DG SKULL 1-3V
2 series · 2 of 2 positions shown · non-contrast
Comparison: None.

CLINICAL DATA: MRI clearance evaluation. Reported history of
left-sided cochlear implant.

EXAM:
SKULL - 1-3 VIEW

[[person_name] pa]
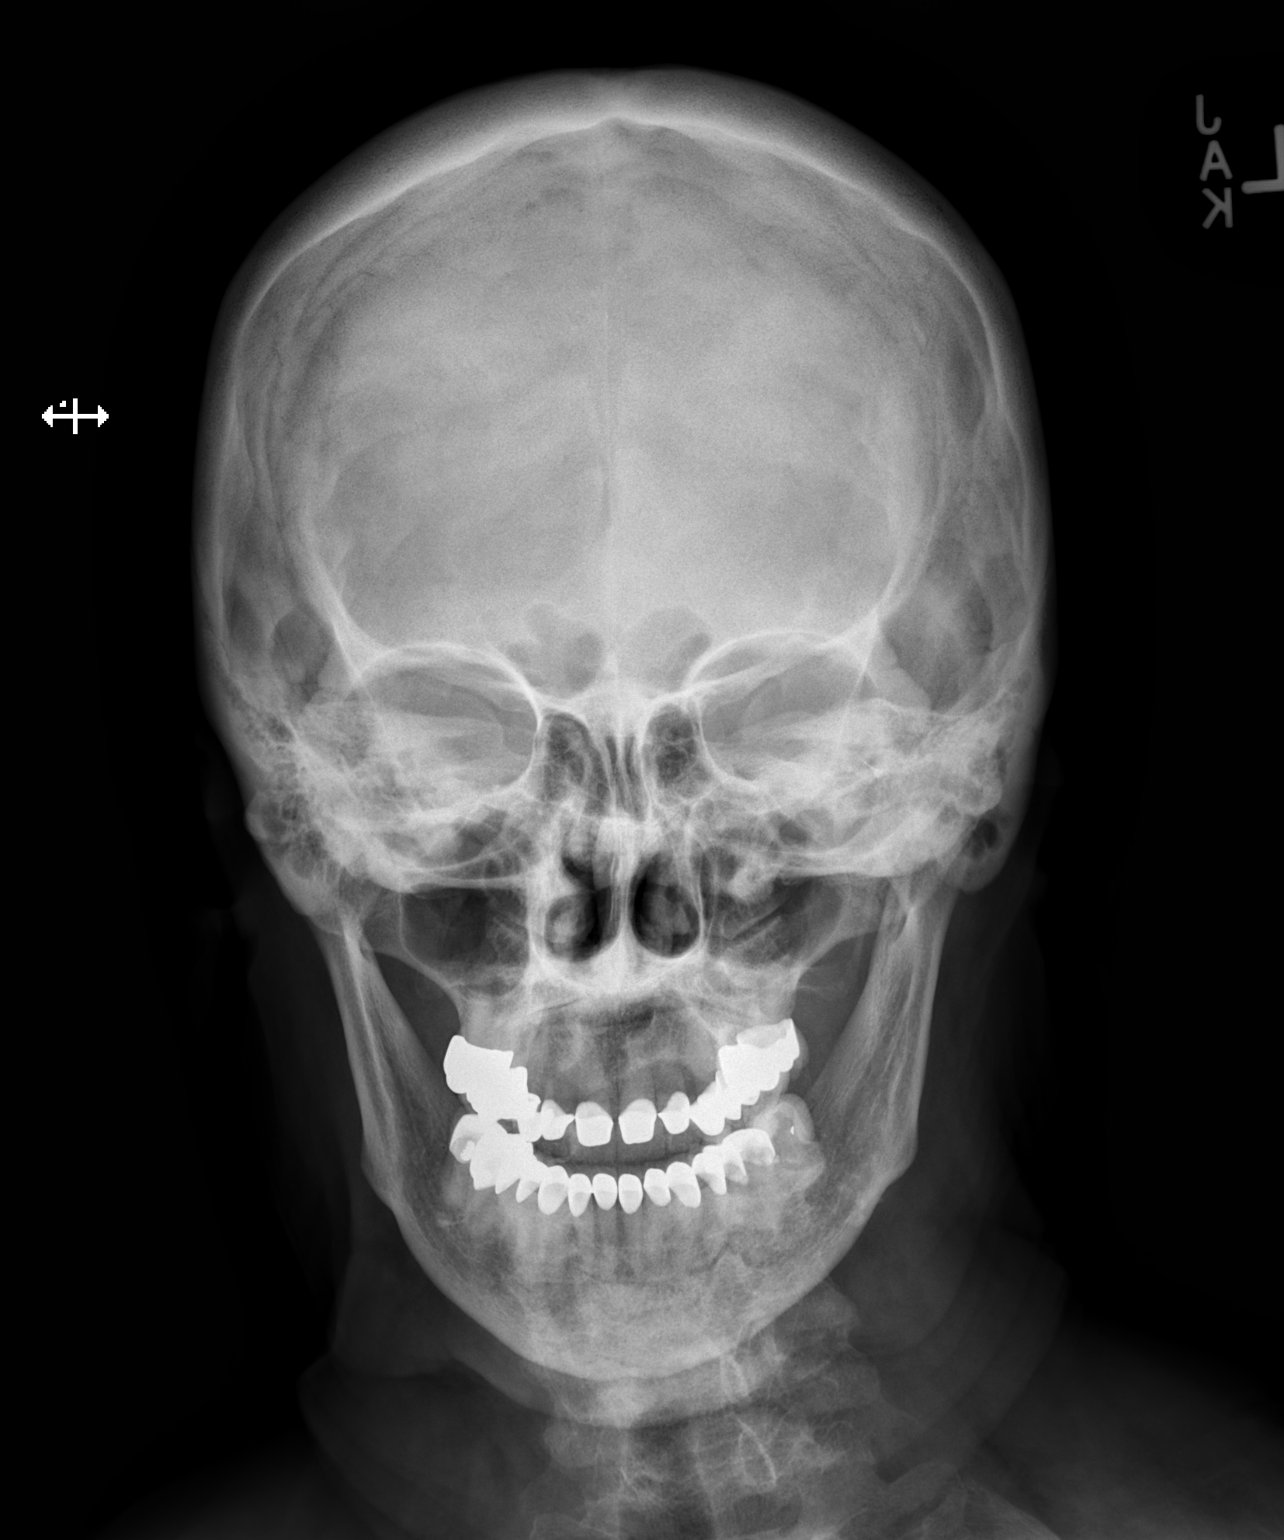

[w skull lat]
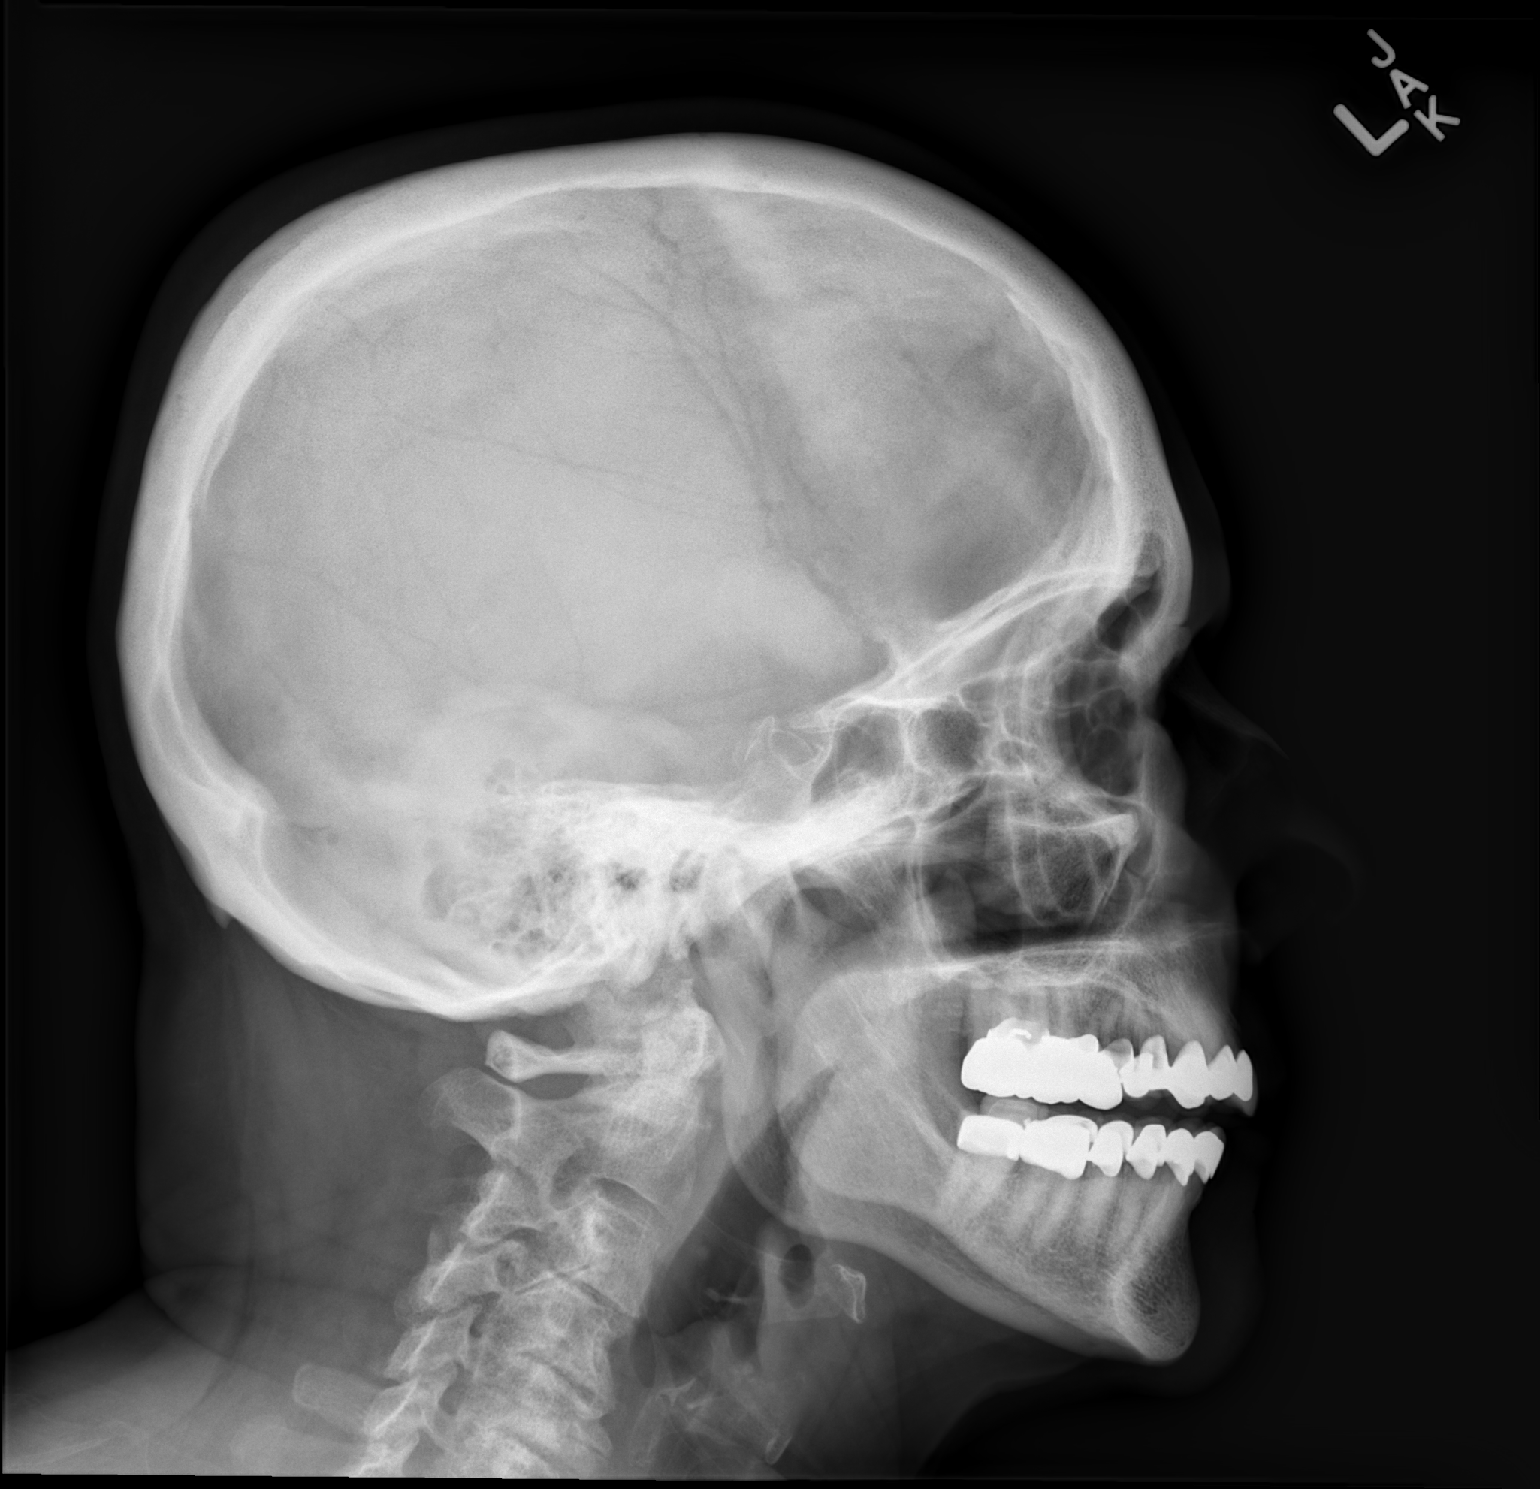

[2 of 2 positions shown; findings below may reference images not displayed]

FINDINGS: No focal osseous lesions or fractures. Metallic dental implants.
Otherwise no radiopaque foreign bodies. Paranasal sinuses appear
clear.
IMPRESSION: Metallic dental implants. Otherwise no radiopaque foreign bodies. No
osseous abnormality.

## 2020-07-09 MED ORDER — GADOBUTROL 1 MMOL/ML IV SOLN
8.0000 mL | Freq: Once | INTRAVENOUS | Status: AC | PRN
  Administered 2020-07-09: 8 mL via INTRAVENOUS

## 2020-07-31 NOTE — Progress Notes (Signed)
Holly Simpson  [phone] [phone]     ID: Holly Simpson DOB: 12-21-1935  MR#: [id number]  CWC#:[id number]  Patient Care Team: [name] as PCP - General (Physician Assistant) Mauro Kaufmann, RN as Oncology Nurse Navigator Rockwell Germany, RN as Oncology Nurse Navigator Rolm Bookbinder, MD as Consulting Physician (General Surgery) Ciella Obi, Virgie Dad, MD as Consulting Physician (Oncology) Eppie Gibson, MD as Attending Physician (Radiation Oncology) Magnus Sinning, MD as Consulting Physician (Physical Medicine and Rehabilitation) Ria Clock, MD as Attending Physician (Radiology) Chauncey Cruel, MD OTHER MD:  CHIEF COMPLAINT: estrogen receptor positive noninvasive breast cancer  CURRENT TREATMENT: Anastrozole; intensified screening   INTERVAL HISTORY: Holly Simpson returns today for follow up of her noninvasive breast cancer accompanied by her Sister [name].Holly Simpson opted to bypass surgery and radiation and start on anastrozole. She is tolerating anastrozole quite well.  She has no arthralgias or myalgias.  Hot flashes and vaginal dryness are not major issues.  Since her last visit, she underwent screening breast MRI on 07/09/2020 showing: breast composition C; known biopsy-proven DCIS within lower-inner right breast, with associated 9 mm enhancing mass; no evidence of malignancy within left breast or of abnormal appearing lymph nodes.  Recall in prior mammography there was an area of 2cm heterogeneous calcifications with a possible mass which however was not seen on ultrasonography.   REVIEW OF SYSTEMS: Holly Simpson is doing well as far as her cancer is concerned.  She does have worsening of left hip pain which is radiating down the right leg some.  She saw Dr. Laurence Spates for this in June and had shots which she says helped a little.  She has not discussed this yet with her primary care physician.  Holly Simpson has had her  vaccines both the Covid and the flu.  A detailed review of systems today was otherwise noncontributory   HISTORY OF CURRENT ILLNESS: From the original intake note:  "Holly Simpson" herself palpated a left breast lump, as well as noted associated tenderness. She underwent bilateral diagnostic mammography with tomography and right breast ultrasonography at [facility] on 01/26/2020 showing: breast density category C; indeterminate 2 cm calcifications in linear distribution within right lower medial breast; possible 6 mm mass along anterior aspect of right lower medial calcifications; no abnormalities were seen sonographically in the right breast or axilla.  Accordingly on 02/13/2020 she proceeded to biopsy of the right breast calcifications in question. The pathology from this procedure ([id number]) showed: ductal carcinoma in situ, intermediate grade. Prognostic indicators significant for: estrogen receptor, 100% positive and progesterone receptor, 90% positive, both with strong staining intensity.  The patient's subsequent history is as detailed below.   PAST MEDICAL HISTORY: Past Medical History:  Diagnosis Date   Hypertension     PAST SURGICAL HISTORY: No past surgical history on file.  She has a history of skin cancer removal from her right ankle and left ear.   FAMILY HISTORY: No family history on file.  Her father died at age 11 and her mother at age 53, both from heart issues. She has one brother and one sister. There is no family history of cancer to her knowledge.   GYNECOLOGIC HISTORY:  No LMP recorded. Menarche: 38-22 years old [place] P 0 LMP around age 86 Contraceptive: never used HRT: never used  Hysterectomy? no BSO? 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  She is  single lives by herself, with no pets.    ADVANCED DIRECTIVES: The patient has named Jana Half June Durene Romans as her healthcare power of attorney.   She can be reached at [phone].   HEALTH MAINTENANCE: Social History   Tobacco Use   Smoking status: Former Smoker   Smokeless tobacco: Never Used  Substance Use Topics   Alcohol use: Yes   Drug use: Never     Colonoscopy: date unsure, repeat not indicated  PAP: "years" ago  Bone density: date unsure   No Known Allergies  Current Outpatient Medications  Medication Sig Dispense Refill   amLODipine (NORVASC) 5 MG tablet amlodipine 5 mg tablet     anastrozole (ARIMIDEX) 1 MG tablet Take 1 tablet (1 mg total) by mouth daily. 90 tablet 4   aspirin EC 81 MG tablet Take 81 mg by mouth daily.     famotidine (PEPCID) 20 MG tablet famotidine 20 mg tablet     furosemide (LASIX) 20 MG tablet      glipiZIDE (GLUCOTROL XL) 2.5 MG 24 hr tablet Take by mouth.     levothyroxine (SYNTHROID) 125 MCG tablet      lisinopril (ZESTRIL) 10 MG tablet TAKE 1 TABLET(10 MG) BY MOUTH EVERY MORNING FOR HIGH BLOOD PRESSURE     Omega-3 Fatty Acids (FISH OIL) 1000 MG CAPS omega-3 acid ethyl esters 1 gram capsule     Vitamin D, Ergocalciferol, (DRISDOL) 1.25 MG (50000 UNIT) CAPS capsule      No current facility-administered medications for this visit.    OBJECTIVE: White woman who appears stated age  There were no vitals filed for this visit.   There is no height or weight on file to calculate BMI.   Wt Readings from Last 3 Encounters:  04/26/20 177 lb 11.2 oz (80.6 kg)  02/22/20 175 lb 6.4 oz (79.6 kg)  11/30/19 180 lb (81.6 kg)      ECOG FS:1 - Symptomatic but completely ambulatory  Sclerae unicteric, EOMs intact Wearing a mask No cervical or supraclavicular adenopathy Lungs no rales or rhonchi Heart regular rate and rhythm Abd soft, nontender, positive bowel sounds MSK no focal spinal tenderness, no upper extremity lymphedema Neuro: nonfocal, well oriented, appropriate affect Breasts: I do not palpate a mass in either breast.  There are no skin or nipple changes of concern.   Both axillae are benign.   LAB RESULTS:  CMP     Component Value Date/Time   NA 135 02/22/2020 1221   K 4.3 02/22/2020 1221   CL 100 02/22/2020 1221   CO2 24 02/22/2020 1221   GLUCOSE 152 (H) 02/22/2020 1221   BUN 18 02/22/2020 1221   CREATININE 1.49 (H) 02/22/2020 1221   CALCIUM 9.4 02/22/2020 1221   PROT 7.4 02/22/2020 1221   ALBUMIN 3.8 02/22/2020 1221   AST 21 02/22/2020 1221   ALT 19 02/22/2020 1221   ALKPHOS 76 02/22/2020 1221   BILITOT 0.7 02/22/2020 1221   GFRNONAA 32 (L) 02/22/2020 1221   GFRAA 38 (L) 02/22/2020 1221    No results found for: TOTALPROTELP, ALBUMINELP, A1GS, A2GS, BETS, BETA2SER, GAMS, MSPIKE, SPEI  Lab Results  Component Value Date   WBC 7.9 02/22/2020   NEUTROABS 5.1 02/22/2020   HGB 15.1 (H) 02/22/2020   HCT 46.8 (H) 02/22/2020   MCV 89.1 02/22/2020   PLT 227 02/22/2020    No results found for: LABCA2  No components found for: ZCHYIF027  No results for input(s): INR in the last 168 hours.  No  results found for: LABCA2  No results found for: WER154  No results found for: MGQ676  No results found for: PPJ093  No results found for: CA2729  No components found for: HGQUANT  No results found for: CEA1 / No results found for: CEA1   No results found for: AFPTUMOR  No results found for: CHROMOGRNA  No results found for: KPAFRELGTCHN, LAMBDASER, KAPLAMBRATIO (kappa/lambda light chains)  No results found for: HGBA, HGBA2QUANT, HGBFQUANT, HGBSQUAN (Hemoglobinopathy evaluation)   No results found for: LDH  No results found for: IRON, TIBC, IRONPCTSAT (Iron and TIBC)  No results found for: FERRITIN  Urinalysis No results found for: COLORURINE, APPEARANCEUR, LABSPEC, PHURINE, GLUCOSEU, HGBUR, BILIRUBINUR, KETONESUR, PROTEINUR, UROBILINOGEN, NITRITE, LEUKOCYTESUR   STUDIES: DG Skull 1-3 Views  Result Date: 07/09/2020 CLINICAL DATA:  MRI clearance evaluation. Reported history of left-sided cochlear implant. EXAM: SKULL -  1-3 VIEW COMPARISON:  None. FINDINGS: No focal osseous lesions or fractures. Metallic dental implants. Otherwise no radiopaque foreign bodies. Paranasal sinuses appear clear. IMPRESSION: Metallic dental implants. Otherwise no radiopaque foreign bodies. No osseous abnormality. Electronically Signed   By: Ilona Sorrel M.D.   On: 07/09/2020 10:56   MR BREAST W & WO CM SCREENING (GI)  Result Date: 07/09/2020 CLINICAL DATA:  History of RIGHT breast DCIS diagnosed by stereotactic biopsy for calcifications on 02/13/2020. Patient has elected for observation under treatment with anastrozole. Ordering physician requested an abbreviated breast MRI. A full screening breast MRI was performed today, however, per the MRI technologist, the charge for today's study will be concordant with the requested abbreviated breast MRI. LABS:  Not applicable EXAM: BILATERAL BREAST MRI WITH AND WITHOUT CONTRAST TECHNIQUE: Multiplanar, multisequence MR images of both breasts were obtained prior to and following the intravenous administration of 8 ml of Gadavist Three-dimensional MR images were rendered by post-processing of the original MR data on an independent workstation. The three-dimensional MR images were interpreted, and findings are reported in the following complete MRI report for this study. Three dimensional images were evaluated at the independent interpreting workstation using the DynaCAD thin client. COMPARISON:  No previous breast MRI. Comparison is made to an outside bilateral diagnostic mammogram dated 01/26/2020. FINDINGS: Breast composition: c. Heterogeneous fibroglandular tissue. Background parenchymal enhancement: Moderate. Right breast: Enhancing mass within the lower inner quadrant of the RIGHT breast, at middle depth, measuring 9 mm, with mixed washout, persistent and plateau enhancement kinetics, with adjacent biopsy clip artifact, corresponding to patient's known RIGHT breast DCIS. No additional suspicious enhancing  mass, non-mass enhancement or secondary signs of malignancy within the RIGHT breast. Left breast: No suspicious enhancing mass, non-mass enhancement or secondary signs of malignancy within the LEFT breast. Lymph nodes: No abnormal appearing lymph nodes. Ancillary findings:  None. IMPRESSION: 1. Known biopsy-proven DCIS within the lower inner quadrant of the RIGHT breast, with an associated 9 mm enhancing mass, with associated biopsy clip. If patient continues to elect for nonsurgical treatment, recommend additional follow-up breast MRI in 6-12 months to ensure stability of today's appearance. 2. No evidence of malignancy within the LEFT breast. RECOMMENDATION: Per treatment plan for patient's known RIGHT breast cancer. As above, if patient continues to elect for nonsurgical treatment, would consider follow-up breast MRI in 6-12 months to ensure stability of today's appearance. BI-RADS CATEGORY  6: Known biopsy-proven malignancy. Electronically Signed   By: Franki Cabot M.D.   On: 07/09/2020 15:36     ELIGIBLE FOR AVAILABLE RESEARCH PROTOCOL: Decided against COMET trial  ASSESSMENT: 84 y.o[place], [place] woman  status post left breast biopsy 02/13/2020 for ductal carcinoma in situ, grade 2, strongly estrogen and progesterone receptor positive  (a) the patient originally palpated a mass, subsequent ultrasound did not document a mass  (1) patient opted for intensified screening with no surgery or radiation (and opted against COMET)  (a) mammography every April  (b) breast MRI every October  (2) anastrozole started 02/22/2020  (a) bone density scan at [facility] April 2022  PLAN: Holly Simpson is tolerating anastrozole well and the plan is to continue that a total of 5 years.  She has not had a bone density in several years and we will obtain one next April at [facility] when she has her next mammography.  She understands we cannot compare the mammogram and the MRI directly.  The mammogram is better but showing  calcifications.  The MRI is better at showing the actual mass measurement.  She will have repeat mammography in April and repeat MRI in October every year.  She will see my 39 assistant in May and see me in November every year  I suggested she contact her primary care physician regarding her right hip osteoarthritis.  She has received both doses of the Pfizer vaccine with good tolerance and she is at her booster as well  I am delighted that she is doing so well.  She knows to call for any other issue admittable before the next visit  Total encounter time 25 minutes.Sarajane Jews C. Zaki Gertsch, MD 08/01/2020 9:11 AM Medical Oncology and Hematology [facility] [place],  [address] Tel. [phone]    Fax. [phone]   This document serves as a record of services personally performed by Lurline Del, MD. It was created on his behalf by Wilburn Mylar, a trained medical scribe. The creation of this record is based on the scribe's personal observations and the provider's statements to them.   I, Lurline Del MD, have reviewed the above documentation for accuracy and completeness, and I agree with the above.   *Total Encounter Time as defined by the Centers for Medicare and Medicaid Services includes, in addition to the face-to-face time of a patient visit (documented in the note above) non-face-to-face time: obtaining and reviewing outside history, ordering and reviewing medications, tests or procedures, care coordination (communications with other health care professionals or caregivers) and documentation in the medical record.

## 2020-08-01 ENCOUNTER — Inpatient Hospital Stay: Payer: Medicare PPO | Attending: Oncology | Admitting: Oncology

## 2020-08-01 ENCOUNTER — Other Ambulatory Visit: Payer: Self-pay

## 2020-08-01 VITALS — BP 137/60 | HR 70 | Temp 97.4°F | Resp 18 | Ht 61.0 in | Wt 178.2 lb

## 2020-08-01 DIAGNOSIS — N6320 Unspecified lump in the left breast, unspecified quadrant: Secondary | ICD-10-CM | POA: Diagnosis not present

## 2020-08-01 DIAGNOSIS — Z79899 Other long term (current) drug therapy: Secondary | ICD-10-CM | POA: Diagnosis not present

## 2020-08-01 DIAGNOSIS — R232 Flushing: Secondary | ICD-10-CM | POA: Insufficient documentation

## 2020-08-01 DIAGNOSIS — Z87891 Personal history of nicotine dependence: Secondary | ICD-10-CM | POA: Diagnosis not present

## 2020-08-01 DIAGNOSIS — Z79811 Long term (current) use of aromatase inhibitors: Secondary | ICD-10-CM | POA: Insufficient documentation

## 2020-08-01 DIAGNOSIS — M1611 Unilateral primary osteoarthritis, right hip: Secondary | ICD-10-CM | POA: Diagnosis not present

## 2020-08-01 DIAGNOSIS — D0511 Intraductal carcinoma in situ of right breast: Secondary | ICD-10-CM | POA: Diagnosis not present

## 2020-08-01 DIAGNOSIS — Z170 Estrogen receptor positive status [ER+]: Secondary | ICD-10-CM | POA: Insufficient documentation

## 2020-08-01 DIAGNOSIS — Z85828 Personal history of other malignant neoplasm of skin: Secondary | ICD-10-CM | POA: Insufficient documentation

## 2020-08-01 MED ORDER — ANASTROZOLE 1 MG PO TABS
1.0000 mg | ORAL_TABLET | Freq: Every day | ORAL | 4 refills | Status: DC
Start: 2020-08-01 — End: 2021-05-27

## 2020-08-27 ENCOUNTER — Ambulatory Visit: Payer: Self-pay | Admitting: Student

## 2020-09-13 NOTE — Patient Instructions (Addendum)
DUE TO COVID-19 ONLY ONE VISITOR IS ALLOWED TO COME WITH YOU AND STAY IN THE WAITING ROOM ONLY DURING PRE OP AND PROCEDURE DAY OF SURGERY. THE 1 VISITOR  MAY VISIT WITH YOU AFTER SURGERY IN YOUR PRIVATE ROOM DURING VISITING HOURS ONLY!  YOU NEED TO HAVE A COVID 19 TEST ON: 09/17/20 @ 10:30 AM , THIS TEST MUST BE DONE BEFORE SURGERY,  COVID TESTING SITE [place]  [id number], IT IS ON THE RIGHT GOING OUT [address] APPROXIMATELY  2 MINUTES PAST [employer] ON THE RIGHT. ONCE YOUR COVID TEST IS COMPLETED,  PLEASE BEGIN THE QUARANTINE INSTRUCTIONS AS OUTLINED IN YOUR HANDOUT.                Holly Simpson    Your procedure is scheduled on: 09/20/20   Report to [facility] Main  Entrance   Report to short stay at: 5:30 AM     Call this number if you have problems the morning of surgery [phone]    Remember:   NO SOLID FOOD AFTER MIDNIGHT THE NIGHT PRIOR TO SURGERY. NOTHING BY MOUTH EXCEPT CLEAR LIQUIDS UNTIL: 4:30 AM . PLEASE FINISH GATORADE DRINK PER SURGEON ORDER  WHICH NEEDS TO BE COMPLETED AT: 4:30 AM .  CLEAR LIQUID DIET   Foods Allowed                                                                     Foods Excluded  Coffee and tea, regular and decaf                             liquids that you cannot  Plain Jell-O any favor except red or purple                                           see through such as: Fruit ices (not with fruit pulp)                                     milk, soups, orange juice  Iced Popsicles                                    All solid food Carbonated beverages, regular and diet                                    Cranberry, grape and apple juices Sports drinks like Gatorade Lightly seasoned clear broth or consume(fat free) Sugar, honey syrup  Sample Menu Breakfast                                Lunch  Supper Cranberry juice                    Beef broth                             Chicken broth Jell-O                                     Grape juice                           Apple juice Coffee or tea                        Jell-O                                      Popsicle                                                Coffee or tea                        Coffee or tea  _____________________________________________________________________   BRUSH YOUR TEETH MORNING OF SURGERY AND RINSE YOUR MOUTH OUT, NO CHEWING GUM CANDY OR MINTS.    Take these medicines the morning of surgery with A SIP OF WATER: anastrozole,amlodipine,famotidine,levothyroxine.               How to Manage Your Diabetes Before and After Surgery  Why is it important to control my blood sugar before and after surgery? . Improving blood sugar levels before and after surgery helps healing and can limit problems. . A way of improving blood sugar control is eating a healthy diet by: o  Eating less sugar and carbohydrates o  Increasing activity/exercise o  Talking with your doctor about reaching your blood sugar goals . High blood sugars (greater than 180 mg/dL) can raise your risk of infections and slow your recovery, so you will need to focus on controlling your diabetes during the weeks before surgery. . Make sure that the doctor who takes care of your diabetes knows about your planned surgery including the date and location.  How do I manage my blood sugar before surgery? . Check your blood sugar at least 4 times a day, starting 2 days before surgery, to make sure that the level is not too high or low. o Check your blood sugar the morning of your surgery when you wake up and every 2 hours until you get to the Short Stay unit. . If your blood sugar is less than 70 mg/dL, you will need to treat for low blood sugar: o Do not take insulin. o Treat a low blood sugar (less than 70 mg/dL) with  cup of clear juice (cranberry or apple), 4 glucose tablets, OR glucose gel. o Recheck blood  sugar in 15 minutes after treatment (to make sure it is greater than 70 mg/dL). If your blood sugar is not greater than 70 mg/dL on recheck, call [phone] for further instructions. . Report your blood sugar to the short  stay nurse when you get to Short Stay.  . If you are admitted to the hospital after surgery: o Your blood sugar will be checked by the staff and you will probably be given insulin after surgery (instead of oral diabetes medicines) to make sure you have good blood sugar levels. o The goal for blood sugar control after surgery is 80-180 mg/dL.   WHAT DO I DO ABOUT MY DIABETES MEDICATION?  Marland Kitchen Do not take oral diabetes medicines (pills) the morning of surgery.  . THE DAY BEFORE SURGERY, take Glipizide in the morning as usual     . THE MORNING OF SURGERY, DO NOT TAKE ANY DIABETES MEDICINE.                  You may not have any metal on your body including hair pins and              piercings  Do not wear jewelry, make-up, lotions, powders or perfumes, deodorant             Do not wear nail polish on your fingernails.  Do not shave  48 hours prior to surgery.          Do not bring valuables to the hospital. [place].  Contacts, dentures or bridgework may not be worn into surgery.  Leave suitcase in the car. After surgery it may be brought to your room.     Patients discharged the day of surgery will not be allowed to drive home. IF YOU ARE HAVING SURGERY AND GOING HOME THE SAME DAY, YOU MUST HAVE AN ADULT TO DRIVE YOU HOME AND BE WITH YOU FOR 24 HOURS. YOU MAY GO HOME BY TAXI OR UBER OR ORTHERWISE, BUT AN ADULT MUST ACCOMPANY YOU HOME AND STAY WITH YOU FOR 24 HOURS.  Name and phone number of your driver:  Special Instructions: N/A              Please read over the following fact sheets you were given: _____________________________________________________________________         [facility] - Preparing for Surgery Before  surgery, you can play an important role.  Because skin is not sterile, your skin needs to be as free of germs as possible.  You can reduce the number of germs on your skin by washing with CHG (chlorahexidine gluconate) soap before surgery.  CHG is an antiseptic cleaner which kills germs and bonds with the skin to continue killing germs even after washing. Please DO NOT use if you have an allergy to CHG or antibacterial soaps.  If your skin becomes reddened/irritated stop using the CHG and inform your nurse when you arrive at Short Stay. Do not shave (including legs and underarms) for at least 48 hours prior to the first CHG shower.  You may shave your face/neck. Please follow these instructions carefully:  1.  Shower with CHG Soap the night before surgery and the  morning of Surgery.  2.  If you choose to wash your hair, wash your hair first as usual with your  normal  shampoo.  3.  After you shampoo, rinse your hair and body thoroughly to remove the  shampoo.                           4.  Use CHG as you would  any other liquid soap.  You can apply chg directly  to the skin and wash                       Gently with a scrungie or clean washcloth.  5.  Apply the CHG Soap to your body ONLY FROM THE NECK DOWN.   Do not use on face/ open                           Wound or open sores. Avoid contact with eyes, ears mouth and genitals (private parts).                       Wash face,  Genitals (private parts) with your normal soap.             6.  Wash thoroughly, paying special attention to the area where your surgery  will be performed.  7.  Thoroughly rinse your body with warm water from the neck down.  8.  DO NOT shower/wash with your normal soap after using and rinsing off  the CHG Soap.                9.  Pat yourself dry with a clean towel.            10.  Wear clean pajamas.            11.  Place clean sheets on your bed the night of your first shower and do not  sleep with pets. Day of Surgery  : Do not apply any lotions/deodorants the morning of surgery.  Please wear clean clothes to the hospital/surgery center.  FAILURE TO FOLLOW THESE INSTRUCTIONS MAY RESULT IN THE CANCELLATION OF YOUR SURGERY PATIENT SIGNATURE_________________________________  NURSE SIGNATURE__________________________________  ________________________________________________________________________   Holly Simpson  An incentive spirometer is a tool that can help keep your lungs clear and active. This tool measures how well you are filling your lungs with each breath. Taking long deep breaths may help reverse or decrease the chance of developing breathing (pulmonary) problems (especially infection) following:  A long period of time when you are unable to move or be active. BEFORE THE PROCEDURE   If the spirometer includes an indicator to show your best effort, your nurse or respiratory therapist will set it to a desired goal.  If possible, sit up straight or lean slightly forward. Try not to slouch.  Hold the incentive spirometer in an upright position. INSTRUCTIONS FOR USE  1. Sit on the edge of your bed if possible, or sit up as far as you can in bed or on a chair. 2. Hold the incentive spirometer in an upright position. 3. Breathe out normally. 4. Place the mouthpiece in your mouth and seal your lips tightly around it. 5. Breathe in slowly and as deeply as possible, raising the piston or the ball toward the top of the column. 6. Hold your breath for 3-5 seconds or for as long as possible. Allow the piston or ball to fall to the bottom of the column. 7. Remove the mouthpiece from your mouth and breathe out normally. 8. Rest for a few seconds and repeat Steps 1 through 7 at least 10 times every 1-2 hours when you are awake. Take your time and take a few normal breaths between deep breaths. 9. The spirometer may include an indicator to show your best effort. Use the indicator as  a goal to work  toward during each repetition. 10. After each set of 10 deep breaths, practice coughing to be sure your lungs are clear. If you have an incision (the cut made at the time of surgery), support your incision when coughing by placing a pillow or rolled up towels firmly against it. Once you are able to get out of bed, walk around indoors and cough well. You may stop using the incentive spirometer when instructed by your caregiver.  RISKS AND COMPLICATIONS  Take your time so you do not get dizzy or light-headed.  If you are in pain, you may need to take or ask for pain medication before doing incentive spirometry. It is harder to take a deep breath if you are having pain. AFTER USE  Rest and breathe slowly and easily.  It can be helpful to keep track of a log of your progress. Your caregiver can provide you with a simple table to help with this. If you are using the spirometer at home, follow these instructions: [place] IF:   You are having difficultly using the spirometer.  You have trouble using the spirometer as often as instructed.  Your pain medication is not giving enough relief while using the spirometer.  You develop fever of 100.5 F (38.1 C) or higher. SEEK IMMEDIATE MEDICAL CARE IF:   You cough up bloody sputum that had not been present before.  You develop fever of 102 F (38.9 C) or greater.  You develop worsening pain at or near the incision site. MAKE SURE YOU:   Understand these instructions.  Will watch your condition.  Will get help right away if you are not doing well or get worse. Document Released: 02/02/2007 Document Revised: 12/15/2011 Document Reviewed: 04/05/2007 [facility] Patient Information 2014 [place], [place].   ________________________________________________________________________

## 2020-09-14 ENCOUNTER — Encounter (HOSPITAL_COMMUNITY)
Admission: RE | Admit: 2020-09-14 | Discharge: 2020-09-14 | Disposition: A | Discharge status: Home / Self Care | Payer: Medicare PPO | Source: Ambulatory Visit | Attending: Orthopedic Surgery | Admitting: Orthopedic Surgery

## 2020-09-14 ENCOUNTER — Encounter (HOSPITAL_COMMUNITY): Payer: Self-pay

## 2020-09-14 ENCOUNTER — Ambulatory Visit: Payer: Self-pay | Admitting: Student

## 2020-09-14 ENCOUNTER — Other Ambulatory Visit: Payer: Self-pay

## 2020-09-14 ENCOUNTER — Inpatient Hospital Stay (HOSPITAL_COMMUNITY): Admission: RE | Admit: 2020-09-14 | Payer: Medicare PPO | Source: Ambulatory Visit

## 2020-09-14 DIAGNOSIS — Z01812 Encounter for preprocedural laboratory examination: Secondary | ICD-10-CM | POA: Diagnosis not present

## 2020-09-14 HISTORY — DX: Unspecified osteoarthritis, unspecified site: M19.90

## 2020-09-14 HISTORY — DX: Unspecified asthma, uncomplicated: J45.909

## 2020-09-14 HISTORY — DX: Prediabetes: R73.03

## 2020-09-14 HISTORY — DX: Malignant (primary) neoplasm, unspecified: C80.1

## 2020-09-14 LAB — PROTIME-INR
INR: 1 (ref 0.8–1.2)
Prothrombin Time: 12.8 seconds (ref 11.4–15.2)

## 2020-09-14 LAB — URINALYSIS, ROUTINE W REFLEX MICROSCOPIC
Bilirubin Urine: NEGATIVE
Glucose, UA: NEGATIVE mg/dL
Hgb urine dipstick: NEGATIVE
Ketones, ur: 5 mg/dL — AB
Nitrite: NEGATIVE
Protein, ur: NEGATIVE mg/dL
Specific Gravity, Urine: 1.02 (ref 1.005–1.030)
pH: 5 (ref 5.0–8.0)

## 2020-09-14 LAB — CBC
HCT: 46.1 % — ABNORMAL HIGH (ref 36.0–46.0)
Hemoglobin: 15.1 g/dL — ABNORMAL HIGH (ref 12.0–15.0)
MCH: 29.9 pg (ref 26.0–34.0)
MCHC: 32.8 g/dL (ref 30.0–36.0)
MCV: 91.3 fL (ref 80.0–100.0)
Platelets: 274 10*3/uL (ref 150–400)
RBC: 5.05 MIL/uL (ref 3.87–5.11)
RDW: 15.3 % (ref 11.5–15.5)
WBC: 7.3 10*3/uL (ref 4.0–10.5)
nRBC: 0 % (ref 0.0–0.2)

## 2020-09-14 LAB — HEMOGLOBIN A1C
Hgb A1c MFr Bld: 6.1 % — ABNORMAL HIGH (ref 4.8–5.6)
Mean Plasma Glucose: 128.37 mg/dL

## 2020-09-14 LAB — COMPREHENSIVE METABOLIC PANEL
ALT: 16 U/L (ref 0–44)
AST: 22 U/L (ref 15–41)
Albumin: 4.4 g/dL (ref 3.5–5.0)
Alkaline Phosphatase: 73 U/L (ref 38–126)
Anion gap: 11 (ref 5–15)
BUN: 21 mg/dL (ref 8–23)
CO2: 24 mmol/L (ref 22–32)
Calcium: 9.3 mg/dL (ref 8.9–10.3)
Chloride: 97 mmol/L — ABNORMAL LOW (ref 98–111)
Creatinine, Ser: 1.08 mg/dL — ABNORMAL HIGH (ref 0.44–1.00)
GFR, Estimated: 51 mL/min — ABNORMAL LOW (ref >60)
Glucose, Bld: 109 mg/dL — ABNORMAL HIGH (ref 70–99)
Potassium: 4.3 mmol/L (ref 3.5–5.1)
Sodium: 132 mmol/L — ABNORMAL LOW (ref 135–145)
Total Bilirubin: 0.6 mg/dL (ref 0.3–1.2)
Total Protein: 7.9 g/dL (ref 6.5–8.1)

## 2020-09-14 LAB — SURGICAL PCR SCREEN
MRSA, PCR: NEGATIVE
Staphylococcus aureus: NEGATIVE

## 2020-09-14 NOTE — Progress Notes (Signed)
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 : Clearance: 08/16/20 Cardiologist -   Chest x-ray -  EKG - Requested Stress Test -  ECHO -  Cardiac Cath -  Pacemaker/ICD device last checked:  Sleep Study -  CPAP -   Fasting Blood Sugar -  Checks Blood Sugar _____ times a day  Blood Thinner Instructions: Aspirin Instructions: Have been on hold as per surgeon's instructions. Last Dose:  Anesthesia review: Hx: DIA,HTN  Patient denies shortness of breath, fever, cough and chest pain at PAT appointment   Patient verbalized understanding of instructions that were given to them at the PAT appointment. Patient was also instructed that they will need to review over the PAT instructions again at home before surgery.

## 2020-09-14 NOTE — H&P (View-Only) (Signed)
TOTAL HIP ADMISSION H&P  Patient is admitted for left total hip arthroplasty.  Subjective:  Chief Complaint: left hip pain  HPI: Holly Simpson, 84 y.o. female, has a history of pain and functional disability in the left hip(s) due to arthritis and patient has failed non-surgical conservative treatments for greater than 12 weeks to include NSAID's and/or analgesics and activity modification.  Onset of symptoms was gradual starting 3 years ago with gradually worsening course since that time.The patient noted no past surgery on the left hip(s).  Patient currently rates pain in the left hip at 8 out of 10 with activity. Patient has worsening of pain with activity and weight bearing, pain that interfers with activities of daily living and pain with passive range of motion. Patient has evidence of joint space narrowing by imaging studies. This condition presents safety issues increasing the risk of falls. There is no current active infection.  Patient Active Problem List   Diagnosis Date Noted  . Ductal carcinoma in situ (DCIS) of right breast 02/16/2020  . H/O acute pancreatitis 11/30/2019  . High cholesterol 11/30/2019  . Hyponatremia 03/16/2019  . Prediabetes 09/10/2018  . Hyperglycemia 05/10/2018  . Pain in both lower legs 01/06/2018  . Carpal tunnel syndrome of right wrist 10/09/2017  . Numbness and tingling in right hand 04/29/2017  . Encounter for health maintenance examination 05/06/2016  . Impaired fasting glucose 05/06/2016  . Nuclear sclerotic cataract of left eye 04/03/2016  . Age-related nuclear cataract of both eyes 10/28/2015  . Chronic kidney disease, stage III (moderate) ([place]) 10/28/2015  . Dermatochalasis of both upper eyelids 10/28/2015  . GERD without esophagitis 10/28/2015  . Hearing loss 10/28/2015  . High risk medication use 10/28/2015  . HSV-1 infection 10/28/2015  . Hyperkalemia 10/28/2015  . Keratoconjunctivitis sicca of both eyes not specified as Sjogren's  10/28/2015  . Neck pain 10/28/2015  . Class 2 severe obesity due to excess calories with serious comorbidity and body mass index (BMI) of 35.0 to 35.9 in adult [facility]) 10/28/2015  . Osteoarthritis 10/28/2015  . Vitreous floaters of right eye 10/28/2015  . High blood pressure 06/19/2014  . Hypothyroidism 06/19/2014  . Primary osteoarthritis of right knee 06/19/2014  . Vitamin D deficiency 06/19/2014   Past Medical History:  Diagnosis Date  . Arthritis   . Asthma   . Cancer ([place])    skin ankle,breast  . Hypertension   . Pre-diabetes     Past Surgical History:  Procedure Laterality Date  . APPENDECTOMY    . BACK SURGERY     L3-4  . CHOLECYSTECTOMY    . TONSILLECTOMY    . triguer finguer      Current Outpatient Medications  Medication Sig Dispense Refill Last Dose  . amLODipine (NORVASC) 5 MG tablet Take 5 mg by mouth daily.      Marland Kitchen anastrozole (ARIMIDEX) 1 MG tablet Take 1 tablet (1 mg total) by mouth daily. 90 tablet 4   . aspirin EC 81 MG tablet Take 81 mg by mouth daily.     . famotidine (PEPCID) 20 MG tablet Take 10 mg by mouth daily.      . furosemide (LASIX) 20 MG tablet Take 20 mg by mouth daily.      Marland Kitchen glipiZIDE (GLUCOTROL XL) 2.5 MG 24 hr tablet Take 2.5 mg by mouth daily with breakfast.      . levothyroxine (SYNTHROID) 125 MCG tablet Take 125 mcg by mouth daily before breakfast.      .  lisinopril (ZESTRIL) 10 MG tablet Take 10 mg by mouth daily.      . Multiple Vitamins-Minerals (MULTIVITAMIN WITH MINERALS) tablet Take 1 tablet by mouth daily. Woman 73 +     . Omega-3 Fatty Acids (FISH OIL) 1000 MG CAPS Take 1,000 mg by mouth daily.      . Vitamin D, Ergocalciferol, (DRISDOL) 1.25 MG (50000 UNIT) CAPS capsule Take 50,000 Units by mouth every 7 (seven) days.       No current facility-administered medications for this visit.   No Known Allergies  Social History   Tobacco Use  . Smoking status: Former Research scientist (life sciences)  . Smokeless tobacco: Never Used  . Tobacco comment: Quit  20 years ago  Substance Use Topics  . Alcohol use: Yes    Comment: occas.    No family history on file.   Review of Systems  Constitutional: Negative.   HENT: Negative.   Eyes: Negative.   Respiratory: Negative.   Cardiovascular: Negative.   Gastrointestinal: Negative.   Endocrine: Negative.   Genitourinary: Negative.   Musculoskeletal: Positive for arthralgias.  Skin: Negative.   Allergic/Immunologic: Negative.   Neurological: Negative.   Hematological: Negative.   Psychiatric/Behavioral: Negative.     Objective:  Physical Exam HENT:     Head: Normocephalic.  Eyes:     Pupils: Pupils are equal, round, and reactive to light.  Cardiovascular:     Rate and Rhythm: Normal rate and regular rhythm.     Heart sounds: Normal heart sounds.  Pulmonary:     Breath sounds: Normal breath sounds.  Abdominal:     Palpations: Abdomen is soft.     Tenderness: There is no abdominal tenderness.  Genitourinary:    Comments: Deferred Musculoskeletal:     Cervical back: Normal range of motion.     Comments: Examination of the left hip reveals no skin wounds or lesions. Mild trochanteric tenderness to palpation. She has severely restricted range of motion of the left hip. Pain with terminal flexion and rotation. Pain in the position of impingement. Positive [place]inchfield.  Skin:    General: Skin is warm and dry.  Neurological:     Mental Status: She is alert and oriented to person, place, and time.  Psychiatric:        Mood and Affect: Mood normal.     Vital signs in last 24 hours: @VSRANGES @  Labs:   Estimated body mass index is 33.79 kg/m as calculated from the following:   Height as of an earlier encounter on 09/14/20: 5' (1.524 m).   Weight as of an earlier encounter on 09/14/20: 78.5 kg.   Imaging Review Plain radiographs demonstrate severe degenerative joint disease of the left hip(s). The bone quality appears to be adequate for age and reported activity  level.      Assessment/Plan:  End stage arthritis, left hip(s)  The patient history, physical examination, clinical judgement of the provider and imaging studies are consistent with end stage degenerative joint disease of the left hip(s) and total hip arthroplasty is deemed medically necessary. The treatment options including medical management, injection therapy, arthroscopy and arthroplasty were discussed at length. The risks and benefits of total hip arthroplasty were presented and reviewed. The risks due to aseptic loosening, infection, stiffness, dislocation/subluxation,  thromboembolic complications and other imponderables were discussed.  The patient acknowledged the explanation, agreed to proceed with the plan and consent was signed. Patient is being admitted for inpatient treatment for surgery, pain control, PT, OT, prophylactic antibiotics, VTE prophylaxis,  progressive ambulation and ADL's and discharge planning.The patient is planning to be discharged home after staying overnight for observation

## 2020-09-14 NOTE — H&P (Signed)
TOTAL HIP ADMISSION H&P  Patient is admitted for left total hip arthroplasty.  Subjective:  Chief Complaint: left hip pain  HPI: Holly Simpson, 84 y.o. female, has a history of pain and functional disability in the left hip(s) due to arthritis and patient has failed non-surgical conservative treatments for greater than 12 weeks to include NSAID's and/or analgesics and activity modification.  Onset of symptoms was gradual starting 3 years ago with gradually worsening course since that time.The patient noted no past surgery on the left hip(s).  Patient currently rates pain in the left hip at 8 out of 10 with activity. Patient has worsening of pain with activity and weight bearing, pain that interfers with activities of daily living and pain with passive range of motion. Patient has evidence of joint space narrowing by imaging studies. This condition presents safety issues increasing the risk of falls. There is no current active infection.  Patient Active Problem List   Diagnosis Date Noted  . Ductal carcinoma in situ (DCIS) of right breast 02/16/2020  . H/O acute pancreatitis 11/30/2019  . High cholesterol 11/30/2019  . Hyponatremia 03/16/2019  . Prediabetes 09/10/2018  . Hyperglycemia 05/10/2018  . Pain in both lower legs 01/06/2018  . Carpal tunnel syndrome of right wrist 10/09/2017  . Numbness and tingling in right hand 04/29/2017  . Encounter for health maintenance examination 05/06/2016  . Impaired fasting glucose 05/06/2016  . Nuclear sclerotic cataract of left eye 04/03/2016  . Age-related nuclear cataract of both eyes 10/28/2015  . Chronic kidney disease, stage III (moderate) ([place]) 10/28/2015  . Dermatochalasis of both upper eyelids 10/28/2015  . GERD without esophagitis 10/28/2015  . Hearing loss 10/28/2015  . High risk medication use 10/28/2015  . HSV-1 infection 10/28/2015  . Hyperkalemia 10/28/2015  . Keratoconjunctivitis sicca of both eyes not specified as Sjogren's  10/28/2015  . Neck pain 10/28/2015  . Class 2 severe obesity due to excess calories with serious comorbidity and body mass index (BMI) of 35.0 to 35.9 in adult [facility]) 10/28/2015  . Osteoarthritis 10/28/2015  . Vitreous floaters of right eye 10/28/2015  . High blood pressure 06/19/2014  . Hypothyroidism 06/19/2014  . Primary osteoarthritis of right knee 06/19/2014  . Vitamin D deficiency 06/19/2014   Past Medical History:  Diagnosis Date  . Arthritis   . Asthma   . Cancer ([place])    skin ankle,breast  . Hypertension   . Pre-diabetes     Past Surgical History:  Procedure Laterality Date  . APPENDECTOMY    . BACK SURGERY     L3-4  . CHOLECYSTECTOMY    . TONSILLECTOMY    . triguer finguer      Current Outpatient Medications  Medication Sig Dispense Refill Last Dose  . amLODipine (NORVASC) 5 MG tablet Take 5 mg by mouth daily.      Marland Kitchen anastrozole (ARIMIDEX) 1 MG tablet Take 1 tablet (1 mg total) by mouth daily. 90 tablet 4   . aspirin EC 81 MG tablet Take 81 mg by mouth daily.     . famotidine (PEPCID) 20 MG tablet Take 10 mg by mouth daily.      . furosemide (LASIX) 20 MG tablet Take 20 mg by mouth daily.      Marland Kitchen glipiZIDE (GLUCOTROL XL) 2.5 MG 24 hr tablet Take 2.5 mg by mouth daily with breakfast.      . levothyroxine (SYNTHROID) 125 MCG tablet Take 125 mcg by mouth daily before breakfast.      .  lisinopril (ZESTRIL) 10 MG tablet Take 10 mg by mouth daily.      . Multiple Vitamins-Minerals (MULTIVITAMIN WITH MINERALS) tablet Take 1 tablet by mouth daily. Woman 15 +     . Omega-3 Fatty Acids (FISH OIL) 1000 MG CAPS Take 1,000 mg by mouth daily.      . Vitamin D, Ergocalciferol, (DRISDOL) 1.25 MG (50000 UNIT) CAPS capsule Take 50,000 Units by mouth every 7 (seven) days.       No current facility-administered medications for this visit.   No Known Allergies  Social History   Tobacco Use  . Smoking status: Former Research scientist (life sciences)  . Smokeless tobacco: Never Used  . Tobacco comment: Quit  20 years ago  Substance Use Topics  . Alcohol use: Yes    Comment: occas.    No family history on file.   Review of Systems  Constitutional: Negative.   HENT: Negative.   Eyes: Negative.   Respiratory: Negative.   Cardiovascular: Negative.   Gastrointestinal: Negative.   Endocrine: Negative.   Genitourinary: Negative.   Musculoskeletal: Positive for arthralgias.  Skin: Negative.   Allergic/Immunologic: Negative.   Neurological: Negative.   Hematological: Negative.   Psychiatric/Behavioral: Negative.     Objective:  Physical Exam HENT:     Head: Normocephalic.  Eyes:     Pupils: Pupils are equal, round, and reactive to light.  Cardiovascular:     Rate and Rhythm: Normal rate and regular rhythm.     Heart sounds: Normal heart sounds.  Pulmonary:     Breath sounds: Normal breath sounds.  Abdominal:     Palpations: Abdomen is soft.     Tenderness: There is no abdominal tenderness.  Genitourinary:    Comments: Deferred Musculoskeletal:     Cervical back: Normal range of motion.     Comments: Examination of the left hip reveals no skin wounds or lesions. Mild trochanteric tenderness to palpation. She has severely restricted range of motion of the left hip. Pain with terminal flexion and rotation. Pain in the position of impingement. Positive Stinchfield.  Skin:    General: Skin is warm and dry.  Neurological:     Mental Status: She is alert and oriented to person, place, and time.  Psychiatric:        Mood and Affect: Mood normal.     Vital signs in last 24 hours: @VSRANGES @  Labs:   Estimated body mass index is 33.79 kg/m as calculated from the following:   Height as of an earlier encounter on 09/14/20: 5' (1.524 m).   Weight as of an earlier encounter on 09/14/20: 78.5 kg.   Imaging Review Plain radiographs demonstrate severe degenerative joint disease of the left hip(s). The bone quality appears to be adequate for age and reported activity  level.      Assessment/Plan:  End stage arthritis, left hip(s)  The patient history, physical examination, clinical judgement of the provider and imaging studies are consistent with end stage degenerative joint disease of the left hip(s) and total hip arthroplasty is deemed medically necessary. The treatment options including medical management, injection therapy, arthroscopy and arthroplasty were discussed at length. The risks and benefits of total hip arthroplasty were presented and reviewed. The risks due to aseptic loosening, infection, stiffness, dislocation/subluxation,  thromboembolic complications and other imponderables were discussed.  The patient acknowledged the explanation, agreed to proceed with the plan and consent was signed. Patient is being admitted for inpatient treatment for surgery, pain control, PT, OT, prophylactic antibiotics, VTE prophylaxis,  progressive ambulation and ADL's and discharge planning.The patient is planning to be discharged home after staying overnight for observation

## 2020-09-17 ENCOUNTER — Other Ambulatory Visit (HOSPITAL_COMMUNITY)
Admission: RE | Admit: 2020-09-17 | Discharge: 2020-09-17 | Disposition: A | Discharge status: Home / Self Care | Payer: Medicare PPO | Source: Ambulatory Visit | Attending: Orthopedic Surgery | Admitting: Orthopedic Surgery

## 2020-09-17 DIAGNOSIS — Z20822 Contact with and (suspected) exposure to covid-19: Secondary | ICD-10-CM | POA: Insufficient documentation

## 2020-09-17 DIAGNOSIS — Z01812 Encounter for preprocedural laboratory examination: Secondary | ICD-10-CM | POA: Diagnosis present

## 2020-09-17 LAB — SARS CORONAVIRUS 2 (TAT 6-24 HRS): SARS Coronavirus 2: NEGATIVE

## 2020-09-19 NOTE — Anesthesia Preprocedure Evaluation (Addendum)
Anesthesia Evaluation  Patient identified by MRN, date of birth, ID band Patient awake    Reviewed: NPO status , Patient's Chart, lab work & pertinent test results  Airway Mallampati: II  TM Distance: >3 FB Neck ROM: Full    Dental  (+) Teeth Intact   Pulmonary asthma , former smoker,    Pulmonary exam normal        Cardiovascular hypertension, Pt. on medications  Rhythm:Regular Rate:Normal     Neuro/Psych negative neurological ROS  negative psych ROS   GI/Hepatic Neg liver ROS, GERD  Medicated and Controlled,  Endo/Other  diabetes, Well Controlled, Type 2, Oral Hypoglycemic AgentsHypothyroidism   Renal/GU negative Renal ROS  negative genitourinary   Musculoskeletal  (+) Arthritis , Osteoarthritis,    Abdominal (+)  Abdomen: soft. Bowel sounds: normal.  Peds  Hematology negative hematology ROS (+)   Anesthesia Other Findings   Reproductive/Obstetrics                            Anesthesia Physical Anesthesia Plan  ASA: II  Anesthesia Plan: MAC and Spinal   Post-op Pain Management:    Induction: Intravenous  PONV Risk Score and Plan: 2 and Ondansetron, Propofol infusion and Treatment may vary due to age or medical condition  Airway Management Planned: Simple Face Mask, Natural Airway and Nasal Cannula  Additional Equipment: None  Intra-op Plan:   Post-operative Plan:   Informed Consent: I have reviewed the patients History and Physical, chart, labs and discussed the procedure including the risks, benefits and alternatives for the proposed anesthesia with the patient or authorized representative who has indicated his/her understanding and acceptance.     Dental advisory given  Plan Discussed with: CRNA  Anesthesia Plan Comments: (Lab Results      Component                Value               Date                      WBC                      7.3                 09/14/2020                 HGB                      15.1 (H)            09/14/2020                HCT                      46.1 (H)            09/14/2020                MCV                      91.3                09/14/2020                PLT  274                 09/14/2020           Lab Results      Component                Value               Date                      NA                       132 (L)             09/14/2020                K                        4.3                 09/14/2020                CO2                      24                  09/14/2020                GLUCOSE                  109 (H)             09/14/2020                BUN                      21                  09/14/2020                CREATININE               1.08 (H)            09/14/2020                CALCIUM                  9.3                 09/14/2020                GFRNONAA                 51 (L)              09/14/2020                GFRAA                    38 (L)              02/22/2020          )        Anesthesia Quick Evaluation

## 2020-09-20 ENCOUNTER — Ambulatory Visit (HOSPITAL_COMMUNITY): Payer: Medicare PPO

## 2020-09-20 ENCOUNTER — Encounter (HOSPITAL_COMMUNITY): Payer: Self-pay | Admitting: Orthopedic Surgery

## 2020-09-20 ENCOUNTER — Observation Stay (HOSPITAL_COMMUNITY)
Admission: RE | Admit: 2020-09-20 | Discharge: 2020-09-23 | Disposition: A | Discharge status: Home / Self Care | Payer: Medicare PPO | Attending: Orthopedic Surgery | Admitting: Orthopedic Surgery

## 2020-09-20 ENCOUNTER — Ambulatory Visit (HOSPITAL_COMMUNITY): Payer: Medicare PPO | Admitting: Anesthesiology

## 2020-09-20 ENCOUNTER — Encounter (HOSPITAL_COMMUNITY)
Admission: RE | Disposition: A | Discharge status: Home / Self Care | Payer: Self-pay | Source: Home / Self Care | Attending: Orthopedic Surgery

## 2020-09-20 ENCOUNTER — Other Ambulatory Visit: Payer: Self-pay

## 2020-09-20 DIAGNOSIS — N183 Chronic kidney disease, stage 3 unspecified: Secondary | ICD-10-CM | POA: Insufficient documentation

## 2020-09-20 DIAGNOSIS — Z09 Encounter for follow-up examination after completed treatment for conditions other than malignant neoplasm: Secondary | ICD-10-CM

## 2020-09-20 DIAGNOSIS — M25552 Pain in left hip: Secondary | ICD-10-CM | POA: Diagnosis present

## 2020-09-20 DIAGNOSIS — Z79899 Other long term (current) drug therapy: Secondary | ICD-10-CM | POA: Insufficient documentation

## 2020-09-20 DIAGNOSIS — R7303 Prediabetes: Secondary | ICD-10-CM | POA: Diagnosis not present

## 2020-09-20 DIAGNOSIS — Z7984 Long term (current) use of oral hypoglycemic drugs: Secondary | ICD-10-CM | POA: Diagnosis not present

## 2020-09-20 DIAGNOSIS — I129 Hypertensive chronic kidney disease with stage 1 through stage 4 chronic kidney disease, or unspecified chronic kidney disease: Secondary | ICD-10-CM | POA: Diagnosis not present

## 2020-09-20 DIAGNOSIS — E039 Hypothyroidism, unspecified: Secondary | ICD-10-CM | POA: Diagnosis not present

## 2020-09-20 DIAGNOSIS — M1612 Unilateral primary osteoarthritis, left hip: Secondary | ICD-10-CM | POA: Diagnosis present

## 2020-09-20 DIAGNOSIS — Z85828 Personal history of other malignant neoplasm of skin: Secondary | ICD-10-CM | POA: Insufficient documentation

## 2020-09-20 DIAGNOSIS — Z87891 Personal history of nicotine dependence: Secondary | ICD-10-CM | POA: Diagnosis not present

## 2020-09-20 DIAGNOSIS — M1712 Unilateral primary osteoarthritis, left knee: Secondary | ICD-10-CM | POA: Diagnosis not present

## 2020-09-20 DIAGNOSIS — S82122A Displaced fracture of lateral condyle of left tibia, initial encounter for closed fracture: Secondary | ICD-10-CM

## 2020-09-20 DIAGNOSIS — Z7982 Long term (current) use of aspirin: Secondary | ICD-10-CM | POA: Diagnosis not present

## 2020-09-20 DIAGNOSIS — J45909 Unspecified asthma, uncomplicated: Secondary | ICD-10-CM | POA: Insufficient documentation

## 2020-09-20 HISTORY — PX: TOTAL HIP ARTHROPLASTY: SHX124

## 2020-09-20 LAB — TYPE AND SCREEN
ABO/RH(D): A POS
Antibody Screen: NEGATIVE

## 2020-09-20 LAB — GLUCOSE, CAPILLARY
Glucose-Capillary: 115 mg/dL — ABNORMAL HIGH (ref 70–99)
Glucose-Capillary: 140 mg/dL — ABNORMAL HIGH (ref 70–99)
Glucose-Capillary: 144 mg/dL — ABNORMAL HIGH (ref 70–99)
Glucose-Capillary: 161 mg/dL — ABNORMAL HIGH (ref 70–99)

## 2020-09-20 LAB — ABO/RH: ABO/RH(D): A POS

## 2020-09-20 IMAGING — RF DG HIP (WITH PELVIS) OPERATIVE*L*
1 series · 4 of 4 positions shown · non-contrast
Comparison: None.

CLINICAL DATA: Left total hip replacement

EXAM:
OPERATIVE LEFT HIP   1 VIEW
TECHNIQUE: Fluoroscopic spot image(s) were submitted for interpretation
post-operatively.

[Series 1: unknown protocol · 0.20mm/px · 4 of 4 slices shown]
[im 1/4]
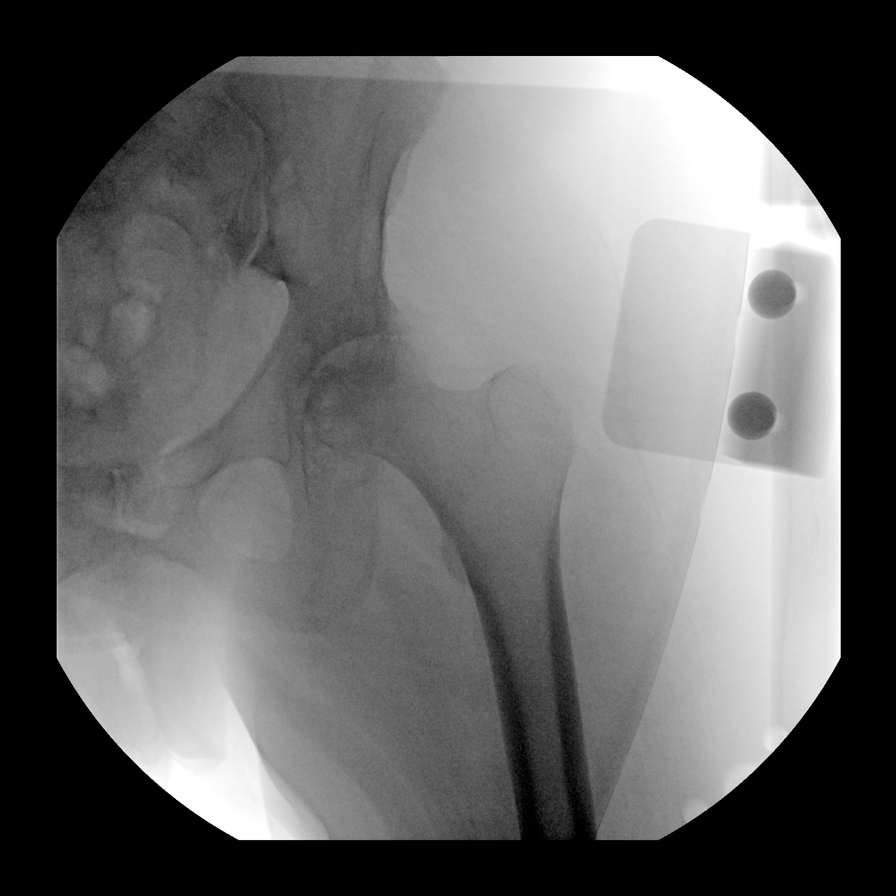
[im 2/4]
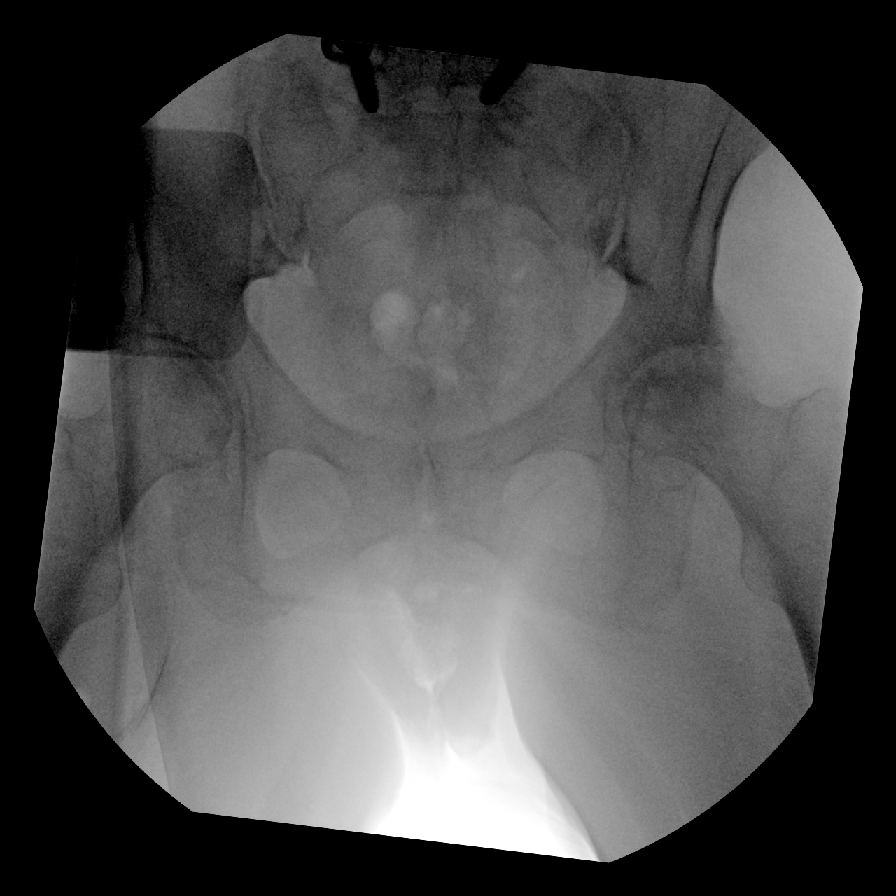
[im 3/4]
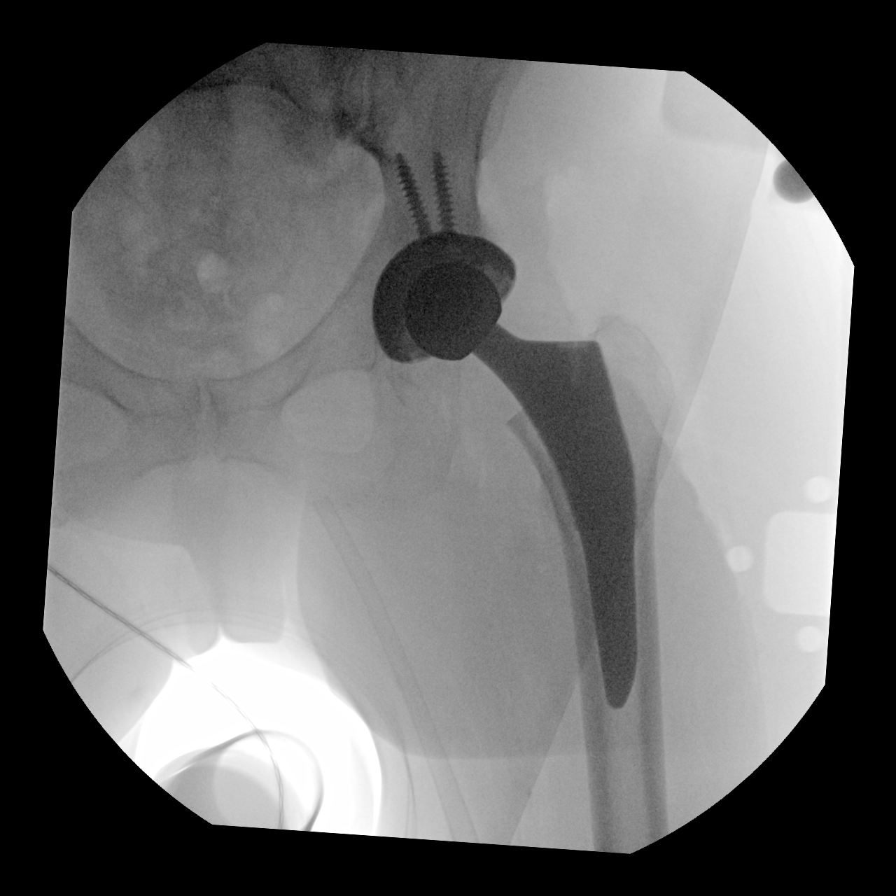
[im 4/4]
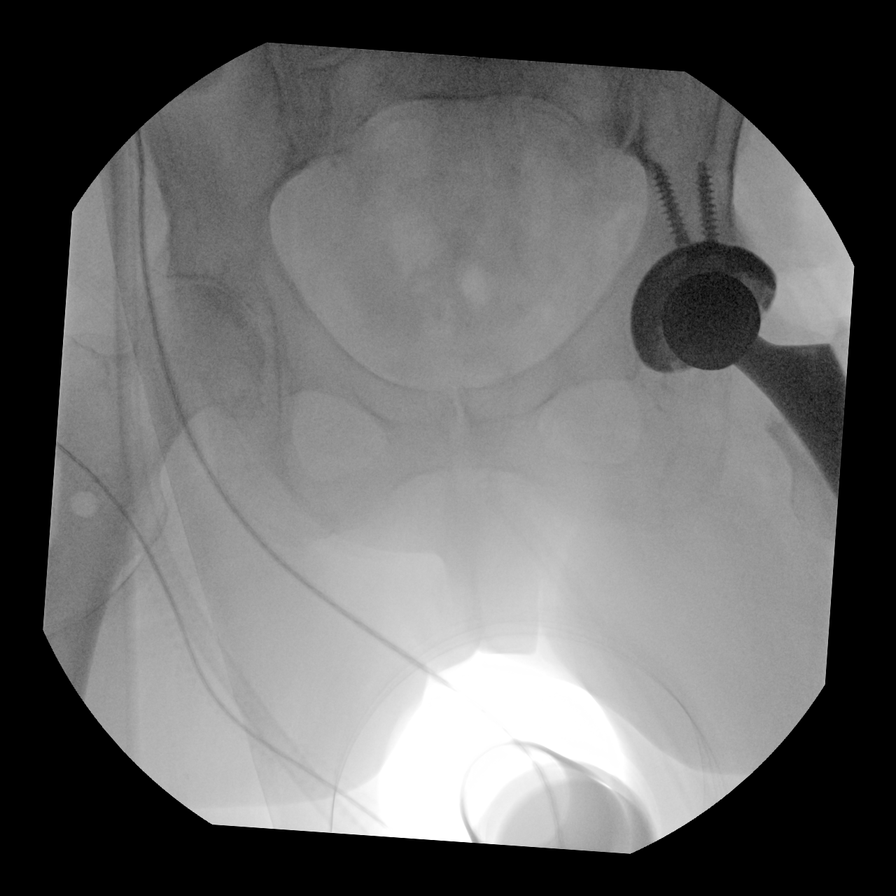

[4 of 4 positions shown; findings below may reference images not displayed]

FINDINGS: Frontal image initially demonstrates avascular necrosis in the left
femoral head. Subsequent frontal images show total hip replacement
on the left with prosthetic components well-seated on frontal view.
No fracture or dislocation. There is mild narrowing of the right hip
joint.
IMPRESSION: Total hip replacement on the left with prosthetic components
well-seated on frontal view. No fracture or dislocation. Narrowing
right hip joint noted.

## 2020-09-20 IMAGING — DX DG PORTABLE PELVIS
1 series · 1 of 1 positions shown · non-contrast
Comparison: Portable exam [EK] hours compared intraoperative images
of [DATE]

CLINICAL DATA: Post LEFT hip surgery

EXAM:
PORTABLE PELVIS 1-2 VIEWS

[pelvis ap]
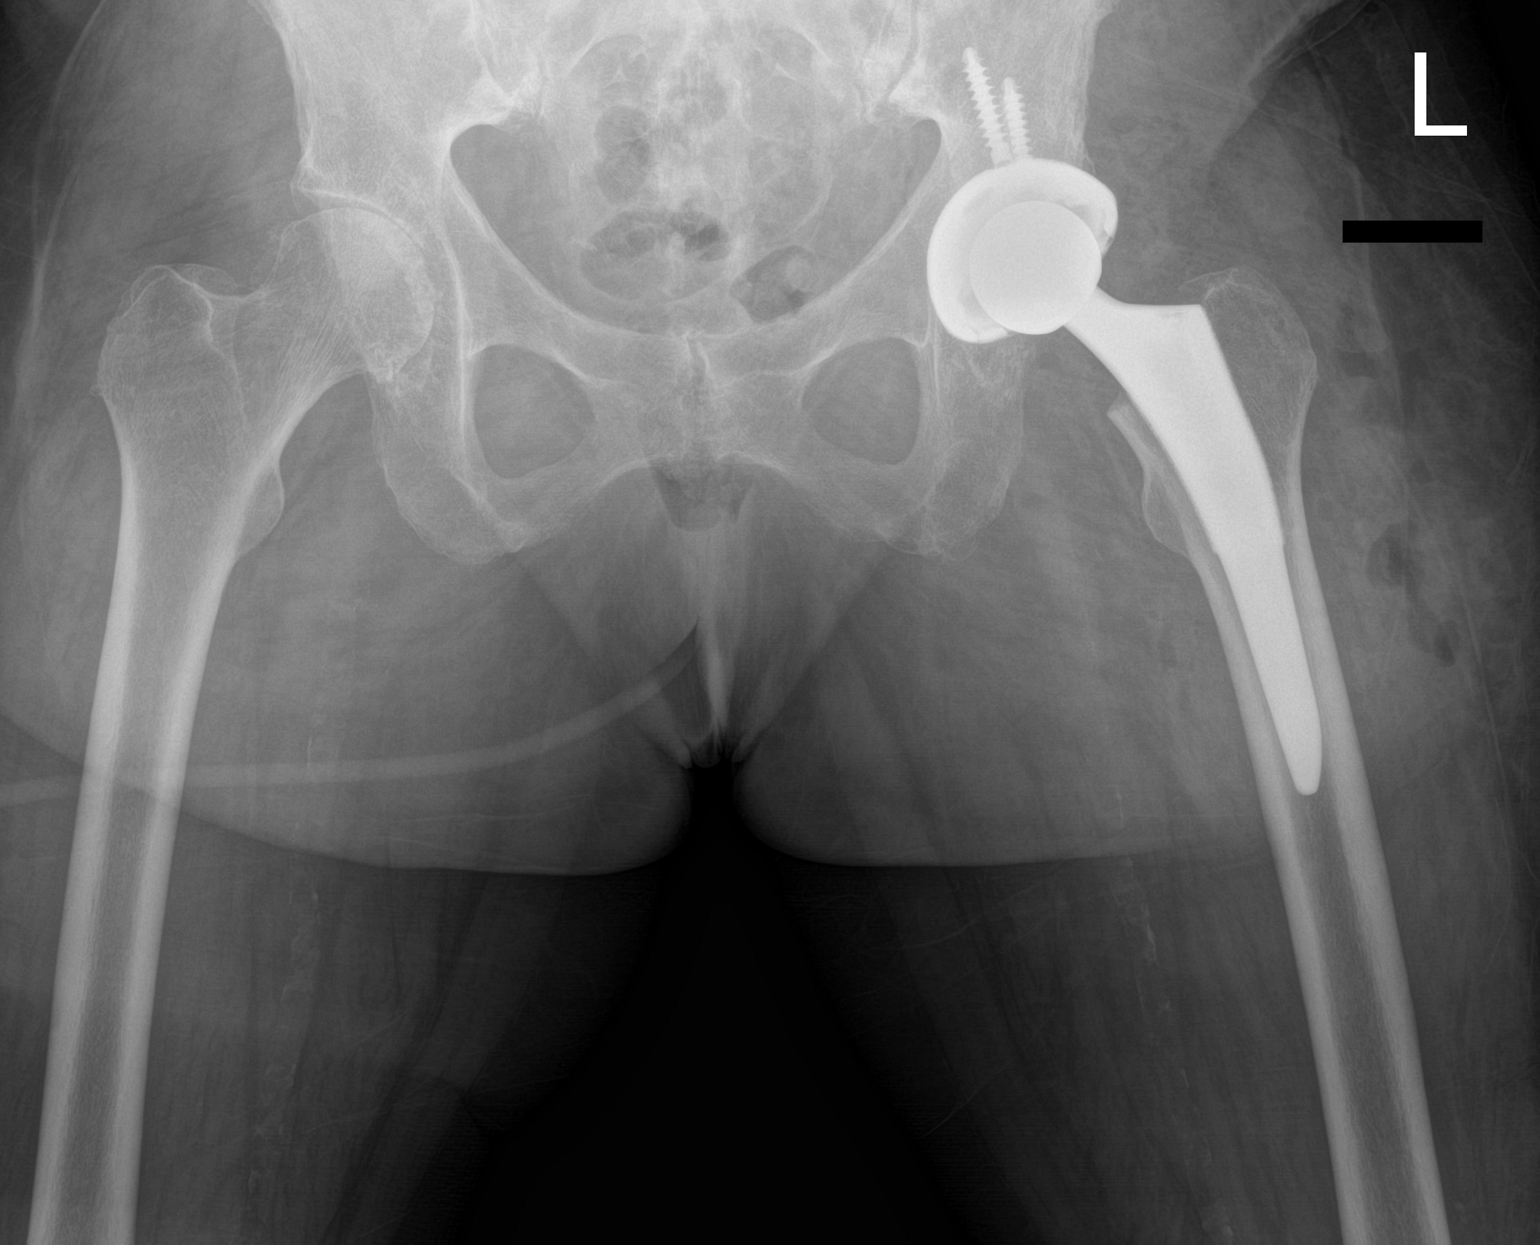

[1 of 1 positions shown; findings below may reference images not displayed]

FINDINGS: LEFT hip prosthesis in expected position.

No fracture, dislocation, or bone destruction.

Bones demineralized.

Joint space narrowing and degenerative changes of the RIGHT hip
joint.
IMPRESSION: LEFT hip prosthesis without acute complication.

## 2020-09-20 SURGERY — ARTHROPLASTY, HIP, TOTAL, ANTERIOR APPROACH
Anesthesia: Monitor Anesthesia Care | Site: Hip | Laterality: Left

## 2020-09-20 MED ORDER — FAMOTIDINE 20 MG PO TABS
10.0000 mg | ORAL_TABLET | Freq: Every day | ORAL | Status: DC
  Administered 2020-09-21 – 2020-09-23 (×3): 10 mg via ORAL
  Filled 2020-09-20 (×3): qty 1

## 2020-09-20 MED ORDER — GLIPIZIDE ER 2.5 MG PO TB24
2.5000 mg | ORAL_TABLET | Freq: Every day | ORAL | Status: DC
  Administered 2020-09-21 – 2020-09-23 (×3): 2.5 mg via ORAL
  Filled 2020-09-20 (×3): qty 1

## 2020-09-20 MED ORDER — PHENOL 1.4 % MT LIQD: 1.0000 | OROMUCOSAL | Status: DC | PRN

## 2020-09-20 MED ORDER — PHENYLEPHRINE HCL-NACL 10-0.9 MG/250ML-% IV SOLN
INTRAVENOUS | Status: DC | PRN
  Administered 2020-09-20 (×2): 25 ug/min via INTRAVENOUS
  Administered 2020-09-20: 15 ug/min via INTRAVENOUS

## 2020-09-20 MED ORDER — PROMETHAZINE HCL 25 MG/ML IJ SOLN
6.2500 mg | INTRAMUSCULAR | Status: DC | PRN
  Administered 2020-09-20: 12.5 mg via INTRAVENOUS

## 2020-09-20 MED ORDER — BUPIVACAINE HCL (PF) 0.25 % IJ SOLN
INTRAMUSCULAR | Status: AC
  Filled 2020-09-20: qty 30

## 2020-09-20 MED ORDER — ONDANSETRON HCL 4 MG/2ML IJ SOLN
INTRAMUSCULAR | Status: DC | PRN
  Administered 2020-09-20: 4 mg via INTRAVENOUS

## 2020-09-20 MED ORDER — METOCLOPRAMIDE HCL 5 MG/ML IJ SOLN: 5.0000 mg | Freq: Three times a day (TID) | INTRAMUSCULAR | Status: DC | PRN

## 2020-09-20 MED ORDER — HYDROMORPHONE HCL 1 MG/ML IJ SOLN
INTRAMUSCULAR | Status: AC
  Administered 2020-09-20: 0.5 mg via INTRAVENOUS
  Filled 2020-09-20: qty 1

## 2020-09-20 MED ORDER — DEXAMETHASONE SODIUM PHOSPHATE 10 MG/ML IJ SOLN
INTRAMUSCULAR | Status: DC | PRN
  Administered 2020-09-20: 10 mg via INTRAVENOUS

## 2020-09-20 MED ORDER — MIDAZOLAM HCL 2 MG/2ML IJ SOLN: 1.0000 mg | Freq: Once | INTRAMUSCULAR | Status: AC

## 2020-09-20 MED ORDER — INSULIN ASPART 100 UNIT/ML ~~LOC~~ SOLN: 0.0000 [IU] | Freq: Three times a day (TID) | SUBCUTANEOUS | Status: DC

## 2020-09-20 MED ORDER — HYDROMORPHONE HCL 1 MG/ML IJ SOLN
0.2500 mg | INTRAMUSCULAR | Status: DC | PRN
Start: 2020-09-20 — End: 2020-09-20
  Administered 2020-09-20 (×2): 0.5 mg via INTRAVENOUS

## 2020-09-20 MED ORDER — HYDROCODONE-ACETAMINOPHEN 5-325 MG PO TABS
1.0000 | ORAL_TABLET | ORAL | Status: DC | PRN
  Administered 2020-09-21: 2 via ORAL
  Administered 2020-09-21: 1 via ORAL
  Administered 2020-09-21: 2 via ORAL
  Administered 2020-09-21: 1 via ORAL
  Administered 2020-09-21: 2 via ORAL
  Administered 2020-09-22: 1 via ORAL
  Administered 2020-09-23: 2 via ORAL
  Filled 2020-09-20: qty 1
  Filled 2020-09-20 (×4): qty 2
  Filled 2020-09-20 (×2): qty 1

## 2020-09-20 MED ORDER — CEFAZOLIN SODIUM-DEXTROSE 2-4 GM/100ML-% IV SOLN
2.0000 g | Freq: Four times a day (QID) | INTRAVENOUS | Status: AC
  Administered 2020-09-20 (×2): 2 g via INTRAVENOUS
  Filled 2020-09-20 (×3): qty 100

## 2020-09-20 MED ORDER — ISOPROPYL ALCOHOL 70 % SOLN
Status: AC
  Filled 2020-09-20: qty 480

## 2020-09-20 MED ORDER — SODIUM CHLORIDE 0.9 % IV SOLN: INTRAVENOUS | Status: DC

## 2020-09-20 MED ORDER — ACETAMINOPHEN 10 MG/ML IV SOLN
1000.0000 mg | Freq: Once | INTRAVENOUS | Status: AC
  Administered 2020-09-20: 1000 mg via INTRAVENOUS
  Filled 2020-09-20: qty 100

## 2020-09-20 MED ORDER — LEVOTHYROXINE SODIUM 125 MCG PO TABS
125.0000 ug | ORAL_TABLET | Freq: Every day | ORAL | Status: DC
  Administered 2020-09-21 – 2020-09-23 (×3): 125 ug via ORAL
  Filled 2020-09-20 (×3): qty 1

## 2020-09-20 MED ORDER — SODIUM CHLORIDE 0.9 % IR SOLN
Status: DC | PRN
  Administered 2020-09-20: 3000 mL

## 2020-09-20 MED ORDER — MORPHINE SULFATE (PF) 2 MG/ML IV SOLN: 0.5000 mg | INTRAVENOUS | Status: DC | PRN

## 2020-09-20 MED ORDER — FUROSEMIDE 20 MG PO TABS
20.0000 mg | ORAL_TABLET | Freq: Every day | ORAL | Status: DC
  Administered 2020-09-21 – 2020-09-23 (×3): 20 mg via ORAL
  Filled 2020-09-20 (×3): qty 1

## 2020-09-20 MED ORDER — ONDANSETRON HCL 4 MG/2ML IJ SOLN: 4.0000 mg | Freq: Four times a day (QID) | INTRAMUSCULAR | Status: DC | PRN

## 2020-09-20 MED ORDER — MIDAZOLAM HCL 5 MG/5ML IJ SOLN
INTRAMUSCULAR | Status: DC | PRN
  Administered 2020-09-20: .5 mg via INTRAVENOUS

## 2020-09-20 MED ORDER — MIDAZOLAM HCL 2 MG/2ML IJ SOLN
INTRAMUSCULAR | Status: AC
  Administered 2020-09-20: 0.5 mg via INTRAVENOUS
  Filled 2020-09-20: qty 2

## 2020-09-20 MED ORDER — POVIDONE-IODINE 10 % EX SWAB
2.0000 "application " | Freq: Once | CUTANEOUS | Status: AC
  Administered 2020-09-20: 2 via TOPICAL

## 2020-09-20 MED ORDER — ACETAMINOPHEN 10 MG/ML IV SOLN: 1000.0000 mg | Freq: Once | INTRAVENOUS | Status: DC | PRN

## 2020-09-20 MED ORDER — TRANEXAMIC ACID-NACL 1000-0.7 MG/100ML-% IV SOLN
1000.0000 mg | INTRAVENOUS | Status: AC
  Administered 2020-09-20: 1000 mg via INTRAVENOUS
  Filled 2020-09-20: qty 100

## 2020-09-20 MED ORDER — KETOROLAC TROMETHAMINE 30 MG/ML IJ SOLN
INTRAMUSCULAR | Status: AC
  Filled 2020-09-20: qty 1

## 2020-09-20 MED ORDER — ISOPROPYL ALCOHOL 70 % SOLN
Status: DC | PRN
  Administered 2020-09-20: 1 via TOPICAL

## 2020-09-20 MED ORDER — DIPHENHYDRAMINE HCL 12.5 MG/5ML PO ELIX: 12.5000 mg | ORAL_SOLUTION | ORAL | Status: DC | PRN

## 2020-09-20 MED ORDER — BUPIVACAINE-EPINEPHRINE (PF) 0.5% -1:200000 IJ SOLN
INTRAMUSCULAR | Status: DC | PRN
  Administered 2020-09-20: 30 mL

## 2020-09-20 MED ORDER — CHLORHEXIDINE GLUCONATE 0.12 % MT SOLN
15.0000 mL | Freq: Once | OROMUCOSAL | Status: AC
  Administered 2020-09-20: 15 mL via OROMUCOSAL

## 2020-09-20 MED ORDER — MENTHOL 3 MG MT LOZG: 1.0000 | LOZENGE | OROMUCOSAL | Status: DC | PRN

## 2020-09-20 MED ORDER — CEFAZOLIN SODIUM-DEXTROSE 2-4 GM/100ML-% IV SOLN
2.0000 g | INTRAVENOUS | Status: AC
  Administered 2020-09-20: 2 g via INTRAVENOUS
  Filled 2020-09-20: qty 100

## 2020-09-20 MED ORDER — CELECOXIB 200 MG PO CAPS
200.0000 mg | ORAL_CAPSULE | Freq: Two times a day (BID) | ORAL | Status: DC
  Administered 2020-09-20 – 2020-09-23 (×6): 200 mg via ORAL
  Filled 2020-09-20 (×6): qty 1

## 2020-09-20 MED ORDER — METHOCARBAMOL 500 MG IVPB - SIMPLE MED
500.0000 mg | Freq: Four times a day (QID) | INTRAVENOUS | Status: DC | PRN
  Administered 2020-09-20: 500 mg via INTRAVENOUS
  Filled 2020-09-20: qty 50

## 2020-09-20 MED ORDER — PHENYLEPHRINE HCL (PRESSORS) 10 MG/ML IV SOLN
INTRAVENOUS | Status: AC
  Filled 2020-09-20: qty 1

## 2020-09-20 MED ORDER — ANASTROZOLE 1 MG PO TABS
1.0000 mg | ORAL_TABLET | Freq: Every day | ORAL | Status: DC
  Administered 2020-09-21 – 2020-09-23 (×3): 1 mg via ORAL
  Filled 2020-09-20 (×3): qty 1

## 2020-09-20 MED ORDER — AMLODIPINE BESYLATE 5 MG PO TABS
5.0000 mg | ORAL_TABLET | Freq: Every day | ORAL | Status: DC
  Administered 2020-09-21 – 2020-09-23 (×3): 5 mg via ORAL
  Filled 2020-09-20 (×3): qty 1

## 2020-09-20 MED ORDER — PROPOFOL 500 MG/50ML IV EMUL
INTRAVENOUS | Status: DC | PRN
  Administered 2020-09-20: 50 ug/kg/min via INTRAVENOUS

## 2020-09-20 MED ORDER — ALUM & MAG HYDROXIDE-SIMETH 200-200-20 MG/5ML PO SUSP: 30.0000 mL | ORAL | Status: DC | PRN

## 2020-09-20 MED ORDER — SENNA 8.6 MG PO TABS
2.0000 | ORAL_TABLET | Freq: Every day | ORAL | Status: DC
  Administered 2020-09-20 – 2020-09-22 (×3): 17.2 mg via ORAL
  Filled 2020-09-20 (×3): qty 2

## 2020-09-20 MED ORDER — PROPOFOL 10 MG/ML IV BOLUS
INTRAVENOUS | Status: AC
  Filled 2020-09-20: qty 20

## 2020-09-20 MED ORDER — FENTANYL CITRATE (PF) 100 MCG/2ML IJ SOLN
INTRAMUSCULAR | Status: DC | PRN
  Administered 2020-09-20 (×2): 25 ug via INTRAVENOUS
  Administered 2020-09-20: 50 ug via INTRAVENOUS

## 2020-09-20 MED ORDER — METHOCARBAMOL 500 MG IVPB - SIMPLE MED
INTRAVENOUS | Status: AC
  Filled 2020-09-20: qty 50

## 2020-09-20 MED ORDER — KETOROLAC TROMETHAMINE 30 MG/ML IJ SOLN
INTRAMUSCULAR | Status: DC | PRN
  Administered 2020-09-20: 30 mg via INTRAVENOUS

## 2020-09-20 MED ORDER — DOCUSATE SODIUM 100 MG PO CAPS
100.0000 mg | ORAL_CAPSULE | Freq: Two times a day (BID) | ORAL | Status: DC
  Administered 2020-09-20 – 2020-09-23 (×6): 100 mg via ORAL
  Filled 2020-09-20 (×6): qty 1

## 2020-09-20 MED ORDER — POLYETHYLENE GLYCOL 3350 17 G PO PACK
17.0000 g | PACK | Freq: Every day | ORAL | Status: DC | PRN
Start: 2020-09-20 — End: 2020-09-23

## 2020-09-20 MED ORDER — OMEGA-3-ACID ETHYL ESTERS 1 G PO CAPS
1.0000 g | ORAL_CAPSULE | Freq: Every day | ORAL | Status: DC
  Administered 2020-09-21 – 2020-09-23 (×3): 1 g via ORAL
  Filled 2020-09-20 (×3): qty 1

## 2020-09-20 MED ORDER — INSULIN ASPART 100 UNIT/ML ~~LOC~~ SOLN: 0.0000 [IU] | Freq: Every day | SUBCUTANEOUS | Status: DC

## 2020-09-20 MED ORDER — LISINOPRIL 10 MG PO TABS
10.0000 mg | ORAL_TABLET | Freq: Every day | ORAL | Status: DC
  Administered 2020-09-20 – 2020-09-23 (×4): 10 mg via ORAL
  Filled 2020-09-20 (×3): qty 1

## 2020-09-20 MED ORDER — ASPIRIN 81 MG PO CHEW
81.0000 mg | CHEWABLE_TABLET | Freq: Two times a day (BID) | ORAL | Status: DC
  Administered 2020-09-20 – 2020-09-23 (×6): 81 mg via ORAL
  Filled 2020-09-20 (×6): qty 1

## 2020-09-20 MED ORDER — FENTANYL CITRATE (PF) 100 MCG/2ML IJ SOLN
INTRAMUSCULAR | Status: AC
  Filled 2020-09-20: qty 2

## 2020-09-20 MED ORDER — METHOCARBAMOL 500 MG PO TABS
500.0000 mg | ORAL_TABLET | Freq: Four times a day (QID) | ORAL | Status: DC | PRN
  Administered 2020-09-21 – 2020-09-23 (×8): 500 mg via ORAL
  Filled 2020-09-20 (×9): qty 1

## 2020-09-20 MED ORDER — ONDANSETRON HCL 4 MG/2ML IJ SOLN
INTRAMUSCULAR | Status: AC
  Filled 2020-09-20: qty 2

## 2020-09-20 MED ORDER — ORAL CARE MOUTH RINSE: 15.0000 mL | Freq: Once | OROMUCOSAL | Status: AC

## 2020-09-20 MED ORDER — BUPIVACAINE-EPINEPHRINE 0.5% -1:200000 IJ SOLN
INTRAMUSCULAR | Status: AC
  Filled 2020-09-20: qty 1

## 2020-09-20 MED ORDER — WATER FOR IRRIGATION, STERILE IR SOLN
Status: DC | PRN
  Administered 2020-09-20: 2000 mL

## 2020-09-20 MED ORDER — POVIDONE-IODINE 10 % EX SWAB: 2.0000 "application " | Freq: Once | CUTANEOUS | Status: DC

## 2020-09-20 MED ORDER — PROMETHAZINE HCL 25 MG/ML IJ SOLN
INTRAMUSCULAR | Status: AC
  Filled 2020-09-20: qty 1

## 2020-09-20 MED ORDER — ONDANSETRON HCL 4 MG PO TABS: 4.0000 mg | ORAL_TABLET | Freq: Four times a day (QID) | ORAL | Status: DC | PRN

## 2020-09-20 MED ORDER — SODIUM CHLORIDE (PF) 0.9 % IJ SOLN
INTRAMUSCULAR | Status: AC
  Filled 2020-09-20: qty 50

## 2020-09-20 MED ORDER — SODIUM CHLORIDE (PF) 0.9 % IJ SOLN
INTRAMUSCULAR | Status: DC | PRN
  Administered 2020-09-20: 30 mL

## 2020-09-20 MED ORDER — HYDROMORPHONE HCL 1 MG/ML IJ SOLN
INTRAMUSCULAR | Status: AC
  Filled 2020-09-20: qty 1

## 2020-09-20 MED ORDER — HYDROCODONE-ACETAMINOPHEN 7.5-325 MG PO TABS
1.0000 | ORAL_TABLET | ORAL | Status: DC | PRN
  Administered 2020-09-20: 2 via ORAL
  Administered 2020-09-22 (×3): 1 via ORAL
  Administered 2020-09-23: 2 via ORAL
  Filled 2020-09-20 (×2): qty 1
  Filled 2020-09-20 (×2): qty 2
  Filled 2020-09-20: qty 1
  Filled 2020-09-20: qty 2

## 2020-09-20 MED ORDER — BUPIVACAINE IN DEXTROSE 0.75-8.25 % IT SOLN
INTRATHECAL | Status: DC | PRN
  Administered 2020-09-20: 1.6 mL via INTRATHECAL

## 2020-09-20 MED ORDER — MIDAZOLAM HCL 2 MG/2ML IJ SOLN
INTRAMUSCULAR | Status: AC
  Filled 2020-09-20: qty 2

## 2020-09-20 MED ORDER — DEXAMETHASONE SODIUM PHOSPHATE 10 MG/ML IJ SOLN
INTRAMUSCULAR | Status: AC
  Filled 2020-09-20: qty 1

## 2020-09-20 MED ORDER — METOCLOPRAMIDE HCL 5 MG PO TABS: 5.0000 mg | ORAL_TABLET | Freq: Three times a day (TID) | ORAL | Status: DC | PRN

## 2020-09-20 MED ORDER — ACETAMINOPHEN 325 MG PO TABS
325.0000 mg | ORAL_TABLET | Freq: Four times a day (QID) | ORAL | Status: DC | PRN
Start: 2020-09-21 — End: 2020-09-23

## 2020-09-20 MED ORDER — LACTATED RINGERS IV SOLN: INTRAVENOUS | Status: DC

## 2020-09-20 MED ORDER — ADULT MULTIVITAMIN W/MINERALS CH
1.0000 | ORAL_TABLET | Freq: Every day | ORAL | Status: DC
  Administered 2020-09-21 – 2020-09-23 (×3): 1 via ORAL
  Filled 2020-09-20 (×3): qty 1

## 2020-09-20 MED ORDER — DEXAMETHASONE SODIUM PHOSPHATE 10 MG/ML IJ SOLN
10.0000 mg | Freq: Once | INTRAMUSCULAR | Status: AC
  Administered 2020-09-21: 10 mg via INTRAVENOUS
  Filled 2020-09-20: qty 1

## 2020-09-20 SURGICAL SUPPLY — 66 items
ADH SKN CLS APL DERMABOND .7 (GAUZE/BANDAGES/DRESSINGS) ×1
APL PRP STRL LF DISP 70% ISPRP (MISCELLANEOUS) ×1
BAG DECANTER FOR FLEXI CONT (MISCELLANEOUS) IMPLANT
BAG SPEC THK2 15X12 ZIP CLS (MISCELLANEOUS)
BAG ZIPLOCK 12X15 (MISCELLANEOUS) IMPLANT
BLADE SURG SZ10 CARB STEEL (BLADE) IMPLANT
CHLORAPREP W/TINT 26 (MISCELLANEOUS) ×2 IMPLANT
COVER PERINEAL POST (MISCELLANEOUS) ×2 IMPLANT
COVER SURGICAL LIGHT HANDLE (MISCELLANEOUS) ×2 IMPLANT
COVER WAND RF STERILE (DRAPES) IMPLANT
DECANTER SPIKE VIAL GLASS SM (MISCELLANEOUS) ×2 IMPLANT
DERMABOND ADVANCED (GAUZE/BANDAGES/DRESSINGS) ×1
DERMABOND ADVANCED .7 DNX12 (GAUZE/BANDAGES/DRESSINGS) ×2 IMPLANT
DRAPE IMP U-DRAPE 54X76 (DRAPES) ×2 IMPLANT
DRAPE SHEET LG 3/4 BI-LAMINATE (DRAPES) ×6 IMPLANT
DRAPE STERI IOBAN 125X83 (DRAPES) IMPLANT
DRAPE U-SHAPE 47X51 STRL (DRAPES) ×4 IMPLANT
DRSG AQUACEL AG ADV 3.5X10 (GAUZE/BANDAGES/DRESSINGS) ×2 IMPLANT
DRSG AQUACEL AG ADV 3.5X14 (GAUZE/BANDAGES/DRESSINGS) ×1 IMPLANT
ELECT REM PT RETURN 15FT ADLT (MISCELLANEOUS) ×2 IMPLANT
GAUZE SPONGE 4X4 12PLY STRL (GAUZE/BANDAGES/DRESSINGS) ×2 IMPLANT
GLOVE BIO SURGEON STRL SZ8.5 (GLOVE) ×4 IMPLANT
GLOVE BIOGEL M STRL SZ7.5 (GLOVE) ×4 IMPLANT
GLOVE BIOGEL PI IND STRL 8 (GLOVE) ×1 IMPLANT
GLOVE BIOGEL PI IND STRL 8.5 (GLOVE) ×1 IMPLANT
GLOVE BIOGEL PI INDICATOR 8 (GLOVE) ×1
GLOVE BIOGEL PI INDICATOR 8.5 (GLOVE) ×1
GOWN SPEC L3 XXLG W/TWL (GOWN DISPOSABLE) ×2 IMPLANT
GOWN STRL REUS W/ TWL LRG LVL3 (GOWN DISPOSABLE) ×1 IMPLANT
GOWN STRL REUS W/TWL LRG LVL3 (GOWN DISPOSABLE) ×2
HANDPIECE INTERPULSE COAX TIP (DISPOSABLE) ×2
HEAD FEM STD 32X+5 STRL (Hips) ×1 IMPLANT
HOLDER FOLEY CATH W/STRAP (MISCELLANEOUS) ×2 IMPLANT
HOOD PEEL AWAY FLYTE STAYCOOL (MISCELLANEOUS) ×8 IMPLANT
JET LAVAGE IRRISEPT WOUND (IRRIGATION / IRRIGATOR) ×2
KIT TURNOVER KIT A (KITS) IMPLANT
LAVAGE JET IRRISEPT WOUND (IRRIGATION / IRRIGATOR) ×1 IMPLANT
LINER PINN ACET NEUT 32X52 ×1 IMPLANT
MANIFOLD NEPTUNE II (INSTRUMENTS) ×2 IMPLANT
MARKER SKIN DUAL TIP RULER LAB (MISCELLANEOUS) ×2 IMPLANT
NDL SAFETY ECLIPSE 18X1.5 (NEEDLE) ×1 IMPLANT
NDL SPNL 18GX3.5 QUINCKE PK (NEEDLE) ×1 IMPLANT
NEEDLE HYPO 18GX1.5 SHARP (NEEDLE) ×2
NEEDLE SPNL 18GX3.5 QUINCKE PK (NEEDLE) ×2 IMPLANT
PACK ANTERIOR HIP CUSTOM (KITS) ×2 IMPLANT
PENCIL SMOKE EVACUATOR (MISCELLANEOUS) IMPLANT
PIN SECTOR W/GRIP ACE CUP 52MM (Hips) ×1 IMPLANT
SAW OSC TIP CART 19.5X105X1.3 (SAW) ×2 IMPLANT
SCREW 6.5MMX30MM (Screw) ×2 IMPLANT
SEALER BIPOLAR AQUA 6.0 (INSTRUMENTS) ×2 IMPLANT
SET HNDPC FAN SPRY TIP SCT (DISPOSABLE) ×1 IMPLANT
STEM TRI LOC BPS GRIPTON SZ 5 (Hips) IMPLANT
SUT ETHIBOND NAB CT1 #1 30IN (SUTURE) ×4 IMPLANT
SUT MNCRL AB 3-0 PS2 18 (SUTURE) ×2 IMPLANT
SUT MNCRL AB 4-0 PS2 18 (SUTURE) ×2 IMPLANT
SUT MON AB 2-0 CT1 36 (SUTURE) ×4 IMPLANT
SUT STRATAFIX PDO 1 14 VIOLET (SUTURE) ×2
SUT STRATFX PDO 1 14 VIOLET (SUTURE) ×1
SUT VIC AB 2-0 CT1 27 (SUTURE) ×2
SUT VIC AB 2-0 CT1 TAPERPNT 27 (SUTURE) ×1 IMPLANT
SUTURE STRATFX PDO 1 14 VIOLET (SUTURE) ×1 IMPLANT
SYR 3ML LL SCALE MARK (SYRINGE) ×2 IMPLANT
TRAY FOLEY MTR SLVR 16FR STAT (SET/KITS/TRAYS/PACK) IMPLANT
TRI LOC BPS W GRIPTON SZ 5 (Hips) ×2 IMPLANT
TUBE SUCTION HIGH CAP CLEAR NV (SUCTIONS) ×2 IMPLANT
WATER STERILE IRR 1000ML POUR (IV SOLUTION) ×2 IMPLANT

## 2020-09-20 NOTE — Progress Notes (Signed)
PT Cancellation Note  Patient Details Name: Holly Simpson MRN: [id number] DOB: December 12, 1935   Cancelled Treatment:    Reason Eval/Treat Not Completed: Pain limiting ability to participate;Fatigue/lethargy limiting ability to participate (Patient moaning "oh God, please help me.Marland KitchenMarland KitchenI'm hurting so bad...oh God". Pt also lethargic and unable to keep her eyes open, suspect due to medication, pt given Versed in PACU. Will follow up at later time or tomorrow as pt is able to participate.)  [place], [place] Office [phone] Pager [phone]

## 2020-09-20 NOTE — Anesthesia Postprocedure Evaluation (Signed)
Anesthesia Post Note  Patient: Holly Simpson  Procedure(s) Performed: TOTAL HIP ARTHROPLASTY ANTERIOR APPROACH (Left Hip)     Patient location during evaluation: PACU Anesthesia Type: MAC and Spinal Level of consciousness: awake and alert Pain management: pain level controlled Vital Signs Assessment: post-procedure vital signs reviewed and stable Respiratory status: spontaneous breathing, nonlabored ventilation, respiratory function stable and patient connected to nasal cannula oxygen Cardiovascular status: stable and blood pressure returned to baseline Postop Assessment: no apparent nausea or vomiting Anesthetic complications: no   No complications documented.  Last Vitals:  Vitals:   09/20/20 1331 09/20/20 1423  BP: 130/61 (!) 109/58  Pulse: 91 69  Resp: 18 16  Temp: 36.9 C 36.7 C  SpO2: 99% 98%    Last Pain:  Vitals:   09/20/20 1423  TempSrc: Axillary  PainSc: Asleep   Pain Goal:                   March Rummage Dot Splinter

## 2020-09-20 NOTE — Anesthesia Procedure Notes (Signed)
Spinal  Patient location during procedure: OR Start time: 09/20/2020 7:50 AM End time: 09/20/2020 7:53 AM Staffing Performed: anesthesiologist  Anesthesiologist: Darral Dash, DO Preanesthetic Checklist Completed: patient identified, IV checked, site marked, risks and benefits discussed, surgical consent, monitors and equipment checked, pre-op evaluation and timeout performed Spinal Block Patient position: sitting Prep: DuraPrep Patient monitoring: heart rate, cardiac monitor, continuous pulse ox and blood pressure Approach: midline Location: L3-4 Injection technique: single-shot Needle Needle type: Pencan  Needle gauge: 24 G Needle length: 10 cm Additional Notes Patient identified. Risks/Benefits/Options discussed with patient including but not limited to bleeding, infection, nerve damage, paralysis, failed block, incomplete pain control, headache, blood pressure changes, nausea, vomiting, reactions to medications, itching and postpartum back pain. Confirmed with bedside nurse the patient's most recent platelet count. Confirmed with patient that they are not currently taking any anticoagulation, have any bleeding history or any family history of bleeding disorders. Patient expressed understanding and wished to proceed. All questions were answered. Sterile technique was used throughout the entire procedure. Please see nursing notes for vital signs. Warning signs of high block given to the patient including shortness of breath, tingling/numbness in hands, complete motor block, or any concerning symptoms with instructions to call for help. Patient was given instructions on fall risk and not to get out of bed. All questions and concerns addressed with instructions to call with any issues or inadequate analgesia.

## 2020-09-20 NOTE — Anesthesia Procedure Notes (Signed)
Procedure Name: MAC Date/Time: 09/20/2020 7:38 AM Performed by: Lissa Morales, CRNA Pre-anesthesia Checklist: Patient identified, Emergency Drugs available, Suction available, Patient being monitored and Timeout performed Patient Re-evaluated:Patient Re-evaluated prior to induction Oxygen Delivery Method: Simple face mask Placement Confirmation: positive ETCO2

## 2020-09-20 NOTE — Transfer of Care (Signed)
Immediate Anesthesia Transfer of Care Note  Patient: Holly Simpson  Procedure(s) Performed: TOTAL HIP ARTHROPLASTY ANTERIOR APPROACH (Left Hip)  Patient Location: PACU  Anesthesia Type:Spinal  Level of Consciousness: awake, alert , oriented and patient cooperative  Airway & Oxygen Therapy: Patient Spontanous Breathing and Patient connected to face mask oxygen  Post-op Assessment: Report given to RN, Post -op Vital signs reviewed and stable and Patient moving all extremities X 4  Post vital signs: stable  Last Vitals:  Vitals Value Taken Time  BP 131/59 09/20/20 1030  Temp    Pulse 86 09/20/20 1033  Resp 27 09/20/20 1033  SpO2 96 % 09/20/20 1033  Vitals shown include unvalidated device data.  Last Pain:  Vitals:   09/20/20 0646  TempSrc: Oral         Complications: No complications documented.

## 2020-09-20 NOTE — Op Note (Signed)
OPERATIVE REPORT  SURGEON: Rod Can, MD   ASSISTANT: Cherlynn June, PA-C.  PREOPERATIVE DIAGNOSIS: Left hip arthritis.   POSTOPERATIVE DIAGNOSIS: Left hip arthritis.   PROCEDURE: Left total hip arthroplasty, anterior approach.   IMPLANTS: DePuy Tri Lock stem, size 5, hi offset. DePuy Pinnacle Cup, size 52 mm. DePuy Altrx liner, size 32 by 52 mm, neutral. DePuy metal head ball, size 32 + 5 mm.  ANESTHESIA:  MAC and Spinal  ESTIMATED BLOOD LOSS:-300 mL    ANTIBIOTICS: 2g Ancef.  DRAINS: None.  COMPLICATIONS: None.   CONDITION: PACU - hemodynamically stable.   BRIEF CLINICAL NOTE: Holly Simpson is a 84 y.o. female with a long-standing history of Left hip arthritis. After failing conservative management, the patient was indicated for total hip arthroplasty. The risks, benefits, and alternatives to the procedure were explained, and the patient elected to proceed.  PROCEDURE IN DETAIL: Surgical site was marked by myself in the pre-op holding area. Once inside the operating room, spinal anesthesia was obtained, and a foley catheter was inserted. The patient was then positioned on the Hana table.  All bony prominences were well padded.  The hip was prepped and draped in the normal sterile surgical fashion.  A time-out was called verifying side and site of surgery. The patient received IV antibiotics within 60 minutes of beginning the procedure.   The direct anterior approach to the hip was performed through the Hueter interval.  Lateral femoral circumflex vessels were treated with the Auqumantys. The anterior capsule was exposed and an inverted T capsulotomy was made. The femoral neck cut was made to the level of the templated cut.  A corkscrew was placed into the head and the head was removed.  The femoral head was found to have eburnated bone. The head was passed to the back table and was measured.   Acetabular exposure was achieved, and the pulvinar and labrum were  excised. Sequential reaming of the acetabulum was then performed up to a size 51 mm reamer. A 52 mm cup was then opened and impacted into place at approximately 40 degrees of abduction and 20 degrees of anteversion. I elected to augment the already acceptable press fit fixation with 6.5 mm cancellous screws x 2. The final polyethylene liner was impacted into place and acetabular osteophytes were removed.    I then gained femoral exposure taking care to protect the abductors and greater trochanter.  This was performed using standard external rotation, extension, and adduction.  The capsule was peeled off the inner aspect of the greater trochanter, taking care to preserve the short external rotators. A cookie cutter was used to enter the femoral canal, and then the femoral canal finder was placed.  Sequential broaching was performed up to a size 5.  Calcar planer was used on the femoral neck remnant.  I placed a hi offset neck and a trial head ball.  The hip was reduced.  Leg lengths and offset were checked fluoroscopically.  The hip was dislocated and trial components were removed.  The final implants were placed, and the hip was reduced.  Fluoroscopy was used to confirm component position and leg lengths.  At 90 degrees of external rotation and full extension, the hip was stable to an anterior directed force.   The wound was copiously irrigated with Irrisept solution and normal saline using pule lavage.  Marcaine solution was injected into the periarticular soft tissue.  The wound was closed in layers using #1 Stratafix for the fascia, 2-0 Vicryl  for the subcutaneous fat, 2-0 Monocryl for the deep dermal layer, 3-0 running Monocryl subcuticular stitch, and Dermabond for the skin.  Once the glue was fully dried, an Aquacell Ag dressing was applied.  The patient was transported to the recovery room in stable condition.  Sponge, needle, and instrument counts were correct at the end of the case x2.  The patient  tolerated the procedure well and there were no known complications.  Please note that a surgical assistant was a medical necessity for this procedure to perform it in a safe and expeditious manner. Assistant was necessary to provide appropriate retraction of vital neurovascular structures, to prevent femoral fracture, and to allow for anatomic placement of the prosthesis.

## 2020-09-20 NOTE — Interval H&P Note (Signed)
History and Physical Interval Note:  09/20/2020 7:36 AM  Holly Simpson  has presented today for surgery, with the diagnosis of degenerative joint disease left hip.  The various methods of treatment have been discussed with the patient and family. After consideration of risks, benefits and other options for treatment, the patient has consented to  Procedure(s): TOTAL HIP ARTHROPLASTY ANTERIOR APPROACH (Left) as a surgical intervention.  The patient's history has been reviewed, patient examined, no change in status, stable for surgery.  I have reviewed the patient's chart and labs.  Questions were answered to the patient's satisfaction.    The risks, benefits, and alternatives were discussed with the patient. There are risks associated with the surgery including, but not limited to, problems with anesthesia (death), infection, instability (giving out of the joint), dislocation, differences in leg length/angulation/rotation, fracture of bones, loosening or failure of implants, hematoma (blood accumulation) which may require surgical drainage, blood clots, pulmonary embolism, nerve injury (foot drop and lateral thigh numbness), and blood vessel injury. The patient understands these risks and elects to proceed.    Hilton Cork Judye Lorino

## 2020-09-20 NOTE — Plan of Care (Signed)
Patient rec'd to unit, all care plans initiated.

## 2020-09-20 NOTE — Discharge Instructions (Signed)
°Dr. Ayauna Mcnay °Joint Replacement Specialist °[facility] °[address] °[place], [place] [address] °[phone] ° ° °TOTAL HIP REPLACEMENT POSTOPERATIVE DIRECTIONS ° ° ° °Hip Rehabilitation, Guidelines Following Surgery  ° °WEIGHT BEARING °Weight bearing as tolerated with assist device (walker, cane, etc) as directed, use it as long as suggested by your surgeon or therapist, typically at least 4-6 weeks. ° °The results of a hip operation are greatly improved after range of motion and muscle strengthening exercises. Follow all safety measures which are given to protect your hip. If any of these exercises cause increased pain or swelling in your joint, decrease the amount until you are comfortable again. Then slowly increase the exercises. Call your caregiver if you have problems or questions.  ° °HOME CARE INSTRUCTIONS  °Most of the following instructions are designed to prevent the dislocation of your new hip.  °Remove items at home which could result in a fall. This includes throw rugs or furniture in walking pathways.  °Continue medications as instructed at time of discharge. °· You may have some home medications which will be placed on hold until you complete the course of blood thinner medication. °· You may start showering once you are discharged home. Do not remove your dressing. °Do not put on socks or shoes without following the instructions of your caregivers.   °Sit on chairs with arms. Use the chair arms to help push yourself up when arising.  °Arrange for the use of a toilet seat elevator so you are not sitting low.  °· Walk with walker as instructed.  °You may resume a sexual relationship in one month or when given the OK by your caregiver.  °Use walker as long as suggested by your caregivers.  °You may put full weight on your legs and walk as much as is comfortable. °Avoid periods of inactivity such as sitting longer than an hour when not asleep. This helps prevent  blood clots.  °You may return to work once you are cleared by your surgeon.  °Do not drive a car for 6 weeks or until released by your surgeon.  °Do not drive while taking narcotics.  °Wear elastic stockings for two weeks following surgery during the day but you may remove then at night.  °Make sure you keep all of your appointments after your operation with all of your doctors and caregivers. You should call the office at the above phone number and make an appointment for approximately two weeks after the date of your surgery. °Please pick up a stool softener and laxative for home use as long as you are requiring pain medications. °· ICE to the affected hip every three hours for 30 minutes at a time and then as needed for pain and swelling. Continue to use ice on the hip for pain and swelling from surgery. You may notice swelling that will progress down to the foot and ankle.  This is normal after surgery.  Elevate the leg when you are not up walking on it.   °It is important for you to complete the blood thinner medication as prescribed by your doctor. °· Continue to use the breathing machine which will help keep your temperature down.  It is common for your temperature to cycle up and down following surgery, especially at night when you are not up moving around and exerting yourself.  The breathing machine keeps your lungs expanded and your temperature down. ° °RANGE OF MOTION AND STRENGTHENING EXERCISES  °These exercises are   designed to help you keep full movement of your hip joint. Follow your caregiver's or physical therapist's instructions. Perform all exercises about fifteen times, three times per day or as directed. Exercise both hips, even if you have had only one joint replacement. These exercises can be done on a training (exercise) mat, on the floor, on a table or on a bed. Use whatever works the best and is most comfortable for you. Use music or television while you are exercising so that the exercises  are a pleasant break in your day. This will make your life better with the exercises acting as a break in routine you can look forward to.  °Lying on your back, slowly slide your foot toward your buttocks, raising your knee up off the floor. Then slowly slide your foot back down until your leg is straight again.  °Lying on your back spread your legs as far apart as you can without causing discomfort.  °Lying on your side, raise your upper leg and foot straight up from the floor as far as is comfortable. Slowly lower the leg and repeat.  °Lying on your back, tighten up the muscle in the front of your thigh (quadriceps muscles). You can do this by keeping your leg straight and trying to raise your heel off the floor. This helps strengthen the largest muscle supporting your knee.  °Lying on your back, tighten up the muscles of your buttocks both with the legs straight and with the knee bent at a comfortable angle while keeping your heel on the floor.  ° °SKILLED REHAB INSTRUCTIONS: °If the patient is transferred to a skilled rehab facility following release from the hospital, a list of the current medications will be sent to the facility for the patient to continue.  When discharged from the skilled rehab facility, please have the facility set up the patient's Home Health Physical Therapy prior to being released. Also, the skilled facility will be responsible for providing the patient with their medications at time of release from the facility to include their pain medication and their blood thinner medication. If the patient is still at the rehab facility at time of the two week follow up appointment, the skilled rehab facility will also need to assist the patient in arranging follow up appointment in our office and any transportation needs. ° °MAKE SURE YOU:  °Understand these instructions.  °Will watch your condition.  °Will get help right away if you are not doing well or get worse. ° °Pick up stool softner and  laxative for home use following surgery while on pain medications. °Do not remove your dressing. °The dressing is waterproof--it is OK to take showers. °Continue to use ice for pain and swelling after surgery. °Do not use any lotions or creams on the incision until instructed by your surgeon. °Total Hip Protocol. ° ° °

## 2020-09-21 ENCOUNTER — Encounter (HOSPITAL_COMMUNITY): Payer: Self-pay | Admitting: Orthopedic Surgery

## 2020-09-21 DIAGNOSIS — M1712 Unilateral primary osteoarthritis, left knee: Secondary | ICD-10-CM | POA: Diagnosis not present

## 2020-09-21 LAB — CBC
HCT: 34 % — ABNORMAL LOW (ref 36.0–46.0)
Hemoglobin: 11.2 g/dL — ABNORMAL LOW (ref 12.0–15.0)
MCH: 30.1 pg (ref 26.0–34.0)
MCHC: 32.9 g/dL (ref 30.0–36.0)
MCV: 91.4 fL (ref 80.0–100.0)
Platelets: 220 10*3/uL (ref 150–400)
RBC: 3.72 MIL/uL — ABNORMAL LOW (ref 3.87–5.11)
RDW: 15.1 % (ref 11.5–15.5)
WBC: 11.3 10*3/uL — ABNORMAL HIGH (ref 4.0–10.5)
nRBC: 0 % (ref 0.0–0.2)

## 2020-09-21 LAB — GLUCOSE, CAPILLARY
Glucose-Capillary: 132 mg/dL — ABNORMAL HIGH (ref 70–99)
Glucose-Capillary: 134 mg/dL — ABNORMAL HIGH (ref 70–99)
Glucose-Capillary: 129 mg/dL — ABNORMAL HIGH (ref 70–99)
Glucose-Capillary: 121 mg/dL — ABNORMAL HIGH (ref 70–99)

## 2020-09-21 LAB — BASIC METABOLIC PANEL
Anion gap: 11 (ref 5–15)
BUN: 19 mg/dL (ref 8–23)
CO2: 22 mmol/L (ref 22–32)
Calcium: 8.3 mg/dL — ABNORMAL LOW (ref 8.9–10.3)
Chloride: 97 mmol/L — ABNORMAL LOW (ref 98–111)
Creatinine, Ser: 1.03 mg/dL — ABNORMAL HIGH (ref 0.44–1.00)
GFR, Estimated: 54 mL/min — ABNORMAL LOW (ref >60)
Glucose, Bld: 136 mg/dL — ABNORMAL HIGH (ref 70–99)
Potassium: 4.3 mmol/L (ref 3.5–5.1)
Sodium: 130 mmol/L — ABNORMAL LOW (ref 135–145)

## 2020-09-21 MED ORDER — DOCUSATE SODIUM 100 MG PO CAPS: 100.0000 mg | ORAL_CAPSULE | Freq: Two times a day (BID) | ORAL | 0 refills | Status: DC

## 2020-09-21 MED ORDER — ASPIRIN 81 MG PO CHEW: 81.0000 mg | CHEWABLE_TABLET | Freq: Two times a day (BID) | ORAL | 0 refills | Status: AC

## 2020-09-21 MED ORDER — HYDROCODONE-ACETAMINOPHEN 7.5-325 MG PO TABS: 1.0000 | ORAL_TABLET | ORAL | 0 refills | Status: DC | PRN

## 2020-09-21 MED ORDER — ONDANSETRON HCL 4 MG PO TABS: 4.0000 mg | ORAL_TABLET | Freq: Four times a day (QID) | ORAL | 0 refills | Status: DC | PRN

## 2020-09-21 MED ORDER — SENNA 8.6 MG PO TABS: 2.0000 | ORAL_TABLET | Freq: Every day | ORAL | 0 refills | Status: DC

## 2020-09-21 NOTE — Progress Notes (Signed)
Physical Therapy Treatment Patient Details Name: Holly Simpson MRN: [id number] DOB: 09/08/1936 Today's Date: 09/21/2020    History of Present Illness Pt s/p L THR and with hx of back surgery and breast CA    PT Comments    Pt with increased activity tolerance and initially with improved pain control but pain reasserted on return to bed and nursing alerted.   Follow Up Recommendations  Home health PT     Equipment Recommendations  Rolling walker with 5" wheels    Recommendations for Other Services OT consult     Precautions / Restrictions Precautions Precautions: Fall Restrictions Weight Bearing Restrictions: No    Mobility  Bed Mobility Overal bed mobility: Needs Assistance Bed Mobility: Sit to Supine     Supine to sit: Min assist;+2 for physical assistance;+2 for safety/equipment Sit to supine: Mod assist   General bed mobility comments: cues for sequence and use of R LE to self assist;  Physical assist to manage bil LE  Transfers Overall transfer level: Needs assistance Equipment used: Rolling walker (2 wheeled) Transfers: Sit to/from Stand Sit to Stand: Min assist         General transfer comment: cues for LE management and use of UEs to self assist;  Physical assist to bring wt up and fwd and to balance in standing with RW  Ambulation/Gait Ambulation/Gait assistance: Min assist Gait Distance (Feet): 90 Feet Assistive device: Rolling walker (2 wheeled) Gait Pattern/deviations: Step-to pattern;Step-through pattern;Shuffle;Trunk flexed Gait velocity: cues to slow down for safety   General Gait Details: cues for sequence, posture, pace, position from RW and safety awareness   Stairs             Wheelchair Mobility    Modified Rankin (Stroke Patients Only)       Balance Overall balance assessment: Needs assistance Sitting-balance support: No upper extremity supported;Feet supported Sitting balance-Leahy Scale: Good     Standing  balance support: Bilateral upper extremity supported Standing balance-Leahy Scale: Poor                              Cognition Arousal/Alertness: Awake/alert Behavior During Therapy: WFL for tasks assessed/performed;Impulsive Overall Cognitive Status: Within Functional Limits for tasks assessed                                        Exercises      General Comments        Pertinent Vitals/Pain Pain Assessment: Faces Pain Score: 5  Faces Pain Scale: Hurts whole lot Pain Location: L hip - after pt returned to bed Pain Descriptors / Indicators: Aching;Sore;Guarding;Grimacing Pain Intervention(s): Limited activity within patient's tolerance;Monitored during session;Premedicated before session;Patient requesting pain meds-RN notified;Ice applied    Home Living                      Prior Function            PT Goals (current goals can now be found in the care plan section) Acute Rehab PT Goals Patient Stated Goal: Less pain; regain IND PT Goal Formulation: With patient Time For Goal Achievement: 09/28/20 Potential to Achieve Goals: Good Progress towards PT goals: Progressing toward goals    Frequency    7X/week      PT Plan Current plan remains appropriate    Co-evaluation  AM-PAC PT "6 Clicks" Mobility   Outcome Measure  Help needed turning from your back to your side while in a flat bed without using bedrails?: A Lot Help needed moving from lying on your back to sitting on the side of a flat bed without using bedrails?: A Lot Help needed moving to and from a bed to a chair (including a wheelchair)?: A Little Help needed standing up from a chair using your arms (e.g., wheelchair or bedside chair)?: A Little Help needed to walk in hospital room?: A Little Help needed climbing 3-5 steps with a railing? : A Lot 6 Click Score: 15    End of Session Equipment Utilized During Treatment: Gait belt Activity  Tolerance: Patient tolerated treatment well;Patient limited by pain Patient left: in bed;with call bell/phone within reach;with bed alarm set;with family/visitor present Nurse Communication: Mobility status PT Visit Diagnosis: Muscle weakness (generalized) (M62.81);Difficulty in walking, not elsewhere classified (R26.2);Pain Pain - Right/Left: Left     Time: 1416-1450 PT Time Calculation (min) (ACUTE ONLY): 34 min  Charges:  $Gait Training: 23-37 mins $Therapeutic Activity: 8-22 mins                     Debe Coder PT Acute Rehabilitation Services Pager [phone] Office [phone]    Jasaun Carn 09/21/2020, 3:04 PM

## 2020-09-21 NOTE — Progress Notes (Signed)
    Subjective:  Patient reports pain as moderate.  Denies N/V/CP/SOB.   Objective:   VITALS:   Vitals:   09/20/20 2158 09/21/20 0125 09/21/20 0556 09/21/20 0933  BP: (!) 149/72 (!) 175/96 (!) 146/69 (!) 142/67  Pulse: 70 73 72 74  Resp: 16 16 16 17   Temp: 97.8 F (36.6 C) 97.8 F (36.6 C) 97.7 F (36.5 C) 98.3 F (36.8 C)  TempSrc: Oral  Oral Oral  SpO2: 96% 97% 99% 97%  Weight:      Height:        NAD ABD soft Neurovascular intact Sensation intact distally Intact pulses distally Dorsiflexion/Plantar flexion intact Incision: dressing C/D/I  Lab Results  Component Value Date   WBC 11.3 (H) 09/21/2020   HGB 11.2 (L) 09/21/2020   HCT 34.0 (L) 09/21/2020   MCV 91.4 09/21/2020   PLT 220 09/21/2020   BMET    Component Value Date/Time   NA 130 (L) 09/21/2020 0329   K 4.3 09/21/2020 0329   CL 97 (L) 09/21/2020 0329   CO2 22 09/21/2020 0329   GLUCOSE 136 (H) 09/21/2020 0329   BUN 19 09/21/2020 0329   CREATININE 1.03 (H) 09/21/2020 0329   CREATININE 1.49 (H) 02/22/2020 1221   CALCIUM 8.3 (L) 09/21/2020 0329   GFRNONAA 54 (L) 09/21/2020 0329   GFRNONAA 32 (L) 02/22/2020 1221   GFRAA 38 (L) 02/22/2020 1221     Assessment/Plan: 1 Day Post-Op   Principal Problem:   Osteoarthritis of left hip   WBAT with walker DVT ppx: Aspirin, SCDs, TEDS PO pain control PT/OT Dispo: D/C home tomorrow once cleared by PT and OT. HHPT     Dorothyann Peng 09/21/2020, 12:00 PM  [facility] is now [facility] [address], [place], [place], [place] [address] Phone: [phone] [email or web address] Facebook  [employer]

## 2020-09-21 NOTE — TOC Initial Note (Signed)
Transition of Care [facility]) - Initial/Assessment Note    Patient Details  Name: Holly Simpson MRN: [id number] Date of Birth: 1936/08/08  Transition of [facility]) CM/SW Contact:    Lennart Pall, LCSW Phone Number: 09/21/2020, 4:12 PM  Clinical Narrative:                 Met with pt to review anticipated dc needs. Pt reports that she plans to dc directly home.  Has a family member who will stay with her as well as someone pt has privately arranged.  PT recommending HHPT follow up.  Pt has no DME prior to admissions.  Referrals placed for [facility] and DME (see below).  Will follow until dc in case any further TOC needs.  Expected Discharge Plan: [place] Barriers to Discharge: Continued Medical Work up   Patient Goals and CMS Choice Patient states their goals for this hospitalization and ongoing recovery are:: go home      Expected Discharge Plan and Services Expected Discharge Plan: [place] In-house Referral: Clinical Social Work     Living arrangements for the past 2 months: [place]                 DME Arranged: 3-N-1,Walker rolling DME Agency: [employer] Date DME Agency Contacted: 09/21/20 Time DME Agency Contacted: [phone] Representative spoke with at DME Agency: Ovid Curd HH Arranged: PT [place] Agency: [employer] Date [place]: 09/21/20 Time HH Agency Contacted: 1500 Representative spoke with at [place]: Cindie  Prior Living Arrangements/Services Living arrangements for the past 2 months: [place] with:: Self Patient language and need for interpreter reviewed:: Yes Do you feel safe going back to the place where you live?: Yes      Need for Family Participation in Patient Care: No (Comment) Care giver support system in place?: No (comment) Current home services: DME,Home PT Criminal Activity/Legal Involvement Pertinent to Current Situation/Hospitalization: No - Comment as needed  Activities  of Daily Living Home Assistive Devices/Equipment: Cane (specify quad or straight),Eyeglasses ADL Screening (condition at time of admission) Patient's cognitive ability adequate to safely complete daily activities?: Yes Is the patient deaf or have difficulty hearing?: No Does the patient have difficulty seeing, even when wearing glasses/contacts?: No Does the patient have difficulty concentrating, remembering, or making decisions?: No Patient able to express need for assistance with ADLs?: Yes Does the patient have difficulty dressing or bathing?: Yes Independently performs ADLs?: Yes (appropriate for developmental age) Does the patient have difficulty walking or climbing stairs?: Yes Weakness of Legs: None Weakness of Arms/Hands: None  Permission Sought/Granted                  Emotional Assessment Appearance:: Appears stated age Attitude/Demeanor/Rapport: Engaged,Gracious Affect (typically observed): Accepting Orientation: : Oriented to Place,Oriented to  Time,Oriented to Self,Oriented to Situation Alcohol / Substance Use: Not Applicable Psych Involvement: No (comment)  Admission diagnosis:  Osteoarthritis of left hip [M16.12] Patient Active Problem List   Diagnosis Date Noted  . Osteoarthritis of left hip 09/20/2020  . Ductal carcinoma in situ (DCIS) of right breast 02/16/2020  . H/O acute pancreatitis 11/30/2019  . High cholesterol 11/30/2019  . Hyponatremia 03/16/2019  . Prediabetes 09/10/2018  . Hyperglycemia 05/10/2018  . Pain in both lower legs 01/06/2018  . Carpal tunnel syndrome of right wrist 10/09/2017  . Numbness and tingling in right hand 04/29/2017  . Encounter for health maintenance examination 05/06/2016  .  Impaired fasting glucose 05/06/2016  . Nuclear sclerotic cataract of left eye 04/03/2016  . Age-related nuclear cataract of both eyes 10/28/2015  . Chronic kidney disease, stage III (moderate) ([place]) 10/28/2015  . Dermatochalasis of both upper eyelids  10/28/2015  . GERD without esophagitis 10/28/2015  . Hearing loss 10/28/2015  . High risk medication use 10/28/2015  . HSV-1 infection 10/28/2015  . Hyperkalemia 10/28/2015  . Keratoconjunctivitis sicca of both eyes not specified as Sjogren's 10/28/2015  . Neck pain 10/28/2015  . Class 2 severe obesity due to excess calories with serious comorbidity and body mass index (BMI) of 35.0 to 35.9 in adult [facility]) 10/28/2015  . Osteoarthritis 10/28/2015  . Vitreous floaters of right eye 10/28/2015  . High blood pressure 06/19/2014  . Hypothyroidism 06/19/2014  . Primary osteoarthritis of right knee 06/19/2014  . Vitamin D deficiency 06/19/2014   PCP:  Earnie Larsson, PA-C Pharmacy:   [facility] [place], [place] AT [facility] OF [place] [place] [place] [address] Phone: [phone] Fax: [phone]  [id number] - [place], [place] AT [place] [address] Phone: [phone] Fax: [phone]     Social Determinants of Health (SDOH) Interventions    Readmission Risk Interventions No flowsheet data found.

## 2020-09-21 NOTE — Evaluation (Signed)
Physical Therapy Evaluation Patient Details Name: Holly Simpson MRN: [id number] DOB: [birth year] Today's Date: 09/21/2020   History of Present Illness  Pt s/p L THR and with hx of back surgery and breast CA  Clinical Impression  Pt s/p L THR and presents with decreased L LE strength/ROM and post op pain limiting functional mobility.  Pt's goal is to dc home with intermittent assist of family/friends and she would greatly benefit from follow up HHPT to maximize IND and safety.    Follow Up Recommendations Home health PT    Equipment Recommendations  Rolling walker with 5" wheels (youth)    Recommendations for Other Services OT consult     Precautions / Restrictions Precautions Precautions: Fall Restrictions Weight Bearing Restrictions: No      Mobility  Bed Mobility               General bed mobility comments: Unable to complete 2* pain    Transfers                    Ambulation/Gait                Stairs            Wheelchair Mobility    Modified Rankin (Stroke Patients Only)       Balance                                             Pertinent Vitals/Pain Pain Assessment: 0-10 Pain Score: 8  Pain Location: L hip Pain Descriptors / Indicators: Aching;Grimacing;Guarding;Moaning;Sore Pain Intervention(s): Limited activity within patient's tolerance;Monitored during session;Premedicated before session;Ice applied    Home Living Family/patient expects to be discharged to:: Private residence Living Arrangements: Non-relatives/Friends Available Help at Discharge: Available PRN/intermittently Type of Home: House Home Access: Stairs to enter Entrance Stairs-Rails: None Entrance Stairs-Number of Steps: 2 Home Layout: One level Home Equipment: None      Prior Function Level of Independence: Independent               Hand Dominance        Extremity/Trunk Assessment   Upper Extremity  Assessment Upper Extremity Assessment: Overall WFL for tasks assessed    Lower Extremity Assessment Lower Extremity Assessment: LLE deficits/detail LLE: Unable to fully assess due to pain    Cervical / Trunk Assessment Cervical / Trunk Assessment: Normal  Communication   Communication: No difficulties  Cognition Arousal/Alertness: Awake/alert Behavior During Therapy: WFL for tasks assessed/performed;Impulsive Overall Cognitive Status: Within Functional Limits for tasks assessed                                        General Comments      Exercises Total Joint Exercises Ankle Circles/Pumps: AROM;Both;15 reps;Supine   Assessment/Plan    PT Assessment Patient needs continued PT services  PT Problem List Decreased strength;Decreased range of motion;Decreased activity tolerance;Decreased balance;Decreased mobility;Decreased knowledge of use of DME;Pain       PT Treatment Interventions DME instruction;Gait training;Stair training;Functional mobility training;Therapeutic activities;Therapeutic exercise;Balance training;Patient/family education    PT Goals (Current goals can be found in the Care Plan section)  Acute Rehab PT Goals Patient Stated Goal: Less pain; regain IND PT Goal Formulation: With patient Time For Goal Achievement: 09/28/20 Potential to  Achieve Goals: Good    Frequency 7X/week   Barriers to discharge Decreased caregiver support Pt does not have 24/7    Co-evaluation               AM-PAC PT "6 Clicks" Mobility  Outcome Measure Help needed turning from your back to your side while in a flat bed without using bedrails?: A Lot Help needed moving from lying on your back to sitting on the side of a flat bed without using bedrails?: Total Help needed moving to and from a bed to a chair (including a wheelchair)?: Total Help needed standing up from a chair using your arms (e.g., wheelchair or bedside chair)?: Total Help needed to walk in  hospital room?: Total Help needed climbing 3-5 steps with a railing? : Total 6 Click Score: 7    End of Session   Activity Tolerance: Patient limited by pain Patient left: in bed;with call bell/phone within reach;with nursing/sitter in room Nurse Communication: Mobility status;Patient requests pain meds PT Visit Diagnosis: Muscle weakness (generalized) (M62.81);Difficulty in walking, not elsewhere classified (R26.2);Pain Pain - Right/Left: Left    Time: 7741-4239 PT Time Calculation (min) (ACUTE ONLY): 15 min   Charges:   PT Evaluation $PT Eval Low Complexity: 1 Low          [place] Pager [phone] Office [phone]   Eddie Payette 09/21/2020, 12:40 PM

## 2020-09-21 NOTE — Progress Notes (Signed)
Physical Therapy Treatment Patient Details Name: Holly Simpson MRN: [id number] DOB: 1936/05/11 Today's Date: 09/21/2020    History of Present Illness Pt s/p L THR and with hx of back surgery and breast CA    PT Comments    Pt continues to require increased time, assist and cueing for all tasks but noted improvement in activity tolerance 2* improved pain control following IV pain meds.   Follow Up Recommendations  Home health PT     Equipment Recommendations  Rolling walker with 5" wheels    Recommendations for Other Services OT consult     Precautions / Restrictions Precautions Precautions: Fall Restrictions Weight Bearing Restrictions: No    Mobility  Bed Mobility Overal bed mobility: Needs Assistance Bed Mobility: Supine to Sit     Supine to sit: Min assist;+2 for physical assistance;+2 for safety/equipment     General bed mobility comments: cues for sequence and use of R LE to self assist;  Physical assist ot manage L LE and to bring trunk to upright  Transfers Overall transfer level: Needs assistance Equipment used: Rolling walker (2 wheeled) Transfers: Sit to/from Stand Sit to Stand: Min assist;+2 safety/equipment         General transfer comment: cues for LE management and use of UEs to self assist;  Physical assist to bring wt up and fwd and to balance in standing with RW  Ambulation/Gait Ambulation/Gait assistance: +2 safety/equipment;Min assist;Mod assist Gait Distance (Feet): 48 Feet Assistive device: Rolling walker (2 wheeled) Gait Pattern/deviations: Step-to pattern;Step-through pattern;Shuffle;Trunk flexed Gait velocity: cues to slow down for safety   General Gait Details: cues for sequence, posture, pace, position from RW and safety awareness   Stairs             Wheelchair Mobility    Modified Rankin (Stroke Patients Only)       Balance Overall balance assessment: Needs assistance Sitting-balance support: No upper  extremity supported;Feet supported Sitting balance-Leahy Scale: Fair     Standing balance support: Bilateral upper extremity supported Standing balance-Leahy Scale: Poor                              Cognition Arousal/Alertness: Awake/alert Behavior During Therapy: WFL for tasks assessed/performed;Impulsive Overall Cognitive Status: Within Functional Limits for tasks assessed                                        Exercises Total Joint Exercises Ankle Circles/Pumps: AROM;Both;15 reps;Supine    General Comments        Pertinent Vitals/Pain Pain Assessment: 0-10 Pain Score: 5  Pain Location: L hip Pain Descriptors / Indicators: Aching;Sore Pain Intervention(s): Limited activity within patient's tolerance;Monitored during session;Premedicated before session;Ice applied    Home Living Family/patient expects to be discharged to:: Private residence Living Arrangements: Non-relatives/Friends Available Help at Discharge: Available PRN/intermittently Type of Home: House Home Access: Stairs to enter Entrance Stairs-Rails: None Home Layout: One level Home Equipment: None      Prior Function Level of Independence: Independent          PT Goals (current goals can now be found in the care plan section) Acute Rehab PT Goals Patient Stated Goal: Less pain; regain IND PT Goal Formulation: With patient Time For Goal Achievement: 09/28/20 Potential to Achieve Goals: Good Progress towards PT goals: Progressing toward goals    Frequency  7X/week      PT Plan Current plan remains appropriate    Co-evaluation              AM-PAC PT "6 Clicks" Mobility   Outcome Measure  Help needed turning from your back to your side while in a flat bed without using bedrails?: A Lot Help needed moving from lying on your back to sitting on the side of a flat bed without using bedrails?: A Lot Help needed moving to and from a bed to a chair (including a  wheelchair)?: A Lot Help needed standing up from a chair using your arms (e.g., wheelchair or bedside chair)?: A Lot Help needed to walk in hospital room?: A Lot Help needed climbing 3-5 steps with a railing? : A Lot 6 Click Score: 12    End of Session Equipment Utilized During Treatment: Gait belt Activity Tolerance: Patient tolerated treatment well;Patient limited by fatigue Patient left: in chair;with call bell/phone within reach;with chair alarm set Nurse Communication: Mobility status PT Visit Diagnosis: Muscle weakness (generalized) (M62.81);Difficulty in walking, not elsewhere classified (R26.2);Pain Pain - Right/Left: Left     Time: 3276-1470 PT Time Calculation (min) (ACUTE ONLY): 24 min  Charges:  $Gait Training: 8-22 mins $Therapeutic Activity: 8-22 mins                     Debe Coder PT Acute Rehabilitation Services Pager [phone] Office [phone]    Holly Simpson 09/21/2020, 12:49 PM

## 2020-09-22 DIAGNOSIS — M1712 Unilateral primary osteoarthritis, left knee: Secondary | ICD-10-CM | POA: Diagnosis not present

## 2020-09-22 LAB — GLUCOSE, CAPILLARY
Glucose-Capillary: 99 mg/dL (ref 70–99)
Glucose-Capillary: 97 mg/dL (ref 70–99)
Glucose-Capillary: 76 mg/dL (ref 70–99)
Glucose-Capillary: 130 mg/dL — ABNORMAL HIGH (ref 70–99)

## 2020-09-22 LAB — CBC
HCT: 32 % — ABNORMAL LOW (ref 36.0–46.0)
Hemoglobin: 10.5 g/dL — ABNORMAL LOW (ref 12.0–15.0)
MCH: 29.7 pg (ref 26.0–34.0)
MCHC: 32.8 g/dL (ref 30.0–36.0)
MCV: 90.7 fL (ref 80.0–100.0)
Platelets: 200 10*3/uL (ref 150–400)
RBC: 3.53 MIL/uL — ABNORMAL LOW (ref 3.87–5.11)
RDW: 15.6 % — ABNORMAL HIGH (ref 11.5–15.5)
WBC: 10.3 10*3/uL (ref 4.0–10.5)
nRBC: 0 % (ref 0.0–0.2)

## 2020-09-22 MED ORDER — METHOCARBAMOL 500 MG PO TABS: 500.0000 mg | ORAL_TABLET | Freq: Three times a day (TID) | ORAL | 1 refills | Status: DC | PRN

## 2020-09-22 NOTE — Progress Notes (Signed)
Physical Therapy Treatment Patient Details Name: Holly Simpson MRN: [id number] DOB: [birth year] Today's Date: 09/22/2020    History of Present Illness Pt s/p L THR and with hx of back surgery and breast CA    PT Comments    Pt in good spirits and with marked improvement in activity tolerance and stability with ambulation.   Follow Up Recommendations  Home health PT     Equipment Recommendations  Rolling walker with 5" wheels    Recommendations for Other Services OT consult     Precautions / Restrictions Precautions Precautions: Fall Restrictions Weight Bearing Restrictions: No    Mobility  Bed Mobility Overal bed mobility: Needs Assistance Bed Mobility: Supine to Sit     Supine to sit: Modified independent (Device/Increase time);HOB elevated     General bed mobility comments: Up in chair and requests back to same  Transfers Overall transfer level: Needs assistance Equipment used: Rolling walker (2 wheeled) Transfers: Sit to/from Stand Sit to Stand: Min guard         General transfer comment: cues for LE management and use of UEs to self assist  Ambulation/Gait Ambulation/Gait assistance: Min guard;Supervision Gait Distance (Feet): 200 Feet Assistive device: Rolling walker (2 wheeled) Gait Pattern/deviations: Step-to pattern;Step-through pattern;Shuffle;Trunk flexed Gait velocity: cues to slow down for safety   General Gait Details: cues for sequence, posture, pace, position from RW and safety awareness   Stairs             Wheelchair Mobility    Modified Rankin (Stroke Patients Only)       Balance Overall balance assessment: Needs assistance Sitting-balance support: No upper extremity supported;Feet supported Sitting balance-Leahy Scale: Good     Standing balance support: Bilateral upper extremity supported Standing balance-Leahy Scale: Fair Standing balance comment: Able to take her hands off of walker to manage clothing - but  at times can become unsteady.                            Cognition Arousal/Alertness: Awake/alert Behavior During Therapy: WFL for tasks assessed/performed Overall Cognitive Status: Within Functional Limits for tasks assessed                                 General Comments: Patient exhibits some poor attention span and mild forgetfulness.      Exercises Total Joint Exercises Ankle Circles/Pumps: AROM;Both;15 reps;Supine Quad Sets: AROM;Both;10 reps;Supine Heel Slides: AAROM;Left;20 reps;Supine Hip ABduction/ADduction: AAROM;Left;15 reps;Supine    General Comments        Pertinent Vitals/Pain Pain Assessment: 0-10 Pain Score: 4  Faces Pain Scale: Hurts little more Pain Location: L hip - with movement Pain Descriptors / Indicators: Aching;Sore;Guarding;Grimacing Pain Intervention(s): Limited activity within patient's tolerance;Monitored during session;Premedicated before session;Ice applied    Home Living Family/patient expects to be discharged to:: Private residence Living Arrangements: Non-relatives/Friends Available Help at Discharge: Available PRN/intermittently Type of Home: House Home Access: Stairs to enter Entrance Stairs-Rails: None Home Layout: One level Home Equipment: None      Prior Function Level of Independence: Independent      Comments: Reports she will have friends to stay with her predominantly day and night at discharge.   PT Goals (current goals can now be found in the care plan section) Acute Rehab PT Goals Patient Stated Goal: Regain independence PT Goal Formulation: With patient Time For Goal Achievement: 09/28/20 Potential to Achieve  Goals: Good Progress towards PT goals: Progressing toward goals    Frequency    7X/week      PT Plan Current plan remains appropriate    Co-evaluation              AM-PAC PT "6 Clicks" Mobility   Outcome Measure  Help needed turning from your back to your side  while in a flat bed without using bedrails?: A Lot Help needed moving from lying on your back to sitting on the side of a flat bed without using bedrails?: A Lot Help needed moving to and from a bed to a chair (including a wheelchair)?: A Little Help needed standing up from a chair using your arms (e.g., wheelchair or bedside chair)?: A Little Help needed to walk in hospital room?: A Little Help needed climbing 3-5 steps with a railing? : A Lot 6 Click Score: 15    End of Session Equipment Utilized During Treatment: Gait belt Activity Tolerance: Patient tolerated treatment well Patient left: in chair;with call bell/phone within reach;with chair alarm set Nurse Communication: Mobility status PT Visit Diagnosis: Muscle weakness (generalized) (M62.81);Difficulty in walking, not elsewhere classified (R26.2);Pain     Time: 5449-2010 PT Time Calculation (min) (ACUTE ONLY): 35 min  Charges:  $Gait Training: 8-22 mins $Therapeutic Exercise: 8-22 mins                     [place] Pager [phone] Office [phone]    Holly Simpson 09/22/2020, 12:44 PM

## 2020-09-22 NOTE — Progress Notes (Signed)
Holly Simpson  MRN: [id number] DOB/Age: 10/25/1935 84 y.o. [facility] Procedure: Procedure(s) (LRB): TOTAL HIP ARTHROPLASTY ANTERIOR APPROACH (Left)     Subjective: Up in chair, became a little light headed earlier when up, getting ready to eat breakfast  Vital Signs Temp:  [96.4 F (35.8 C)-98.3 F (36.8 C)] 98.3 F (36.8 C) (12/18 0606) Pulse Rate:  [64-80] 80 (12/18 0606) Resp:  [16-18] 16 (12/18 0606) BP: (141-173)/(65-89) 173/89 (12/18 0606) SpO2:  [96 %-100 %] 97 % (12/18 0606)  Lab Results Recent Labs    09/21/20 0329 09/22/20 0254  WBC 11.3* 10.3  HGB 11.2* 10.5*  HCT 34.0* 32.0*  PLT 220 200   BMET Recent Labs    09/21/20 0329  NA 130*  K 4.3  CL 97*  CO2 22  GLUCOSE 136*  BUN 19  CREATININE 1.03*  CALCIUM 8.3*   INR  Date Value Ref Range Status  09/14/2020 1.0 0.8 - 1.2 Final    Comment:    (NOTE) INR goal varies based on device and disease states. Performed at [facility], [place] [address], [place], [place] [address]      Exam Moving LE well NVI        Plan Possible DC home after PT if no longer orthostatic DC to home with HHPT DME ordered  Jenetta Loges PA-C  09/22/2020, 9:33 AM Contact # [phone]

## 2020-09-22 NOTE — Progress Notes (Signed)
Physical Therapy Treatment Patient Details Name: Holly Simpson MRN: [id number] DOB: January 19, 1936 Today's Date: 09/22/2020    History of Present Illness Pt s/p L THR and with hx of back surgery and breast CA    PT Comments    Continues steady progress with mobility including first review of stairs and bed mobility.  Pt hopeful for dc home tomorrow morning.   Follow Up Recommendations  Home health PT     Equipment Recommendations  Rolling walker with 5" wheels    Recommendations for Other Services OT consult     Precautions / Restrictions Precautions Precautions: Fall Restrictions Weight Bearing Restrictions: No    Mobility  Bed Mobility Overal bed mobility: Needs Assistance Bed Mobility: Sit to Supine       Sit to supine: Min assist   General bed mobility comments: Increased time with cues for sequence and use of belt to self-assist L LE  Transfers Overall transfer level: Needs assistance Equipment used: Rolling walker (2 wheeled) Transfers: Sit to/from Stand Sit to Stand: Min guard;Supervision         General transfer comment: cues for LE management and use of UEs to self assist  Ambulation/Gait Ambulation/Gait assistance: Min guard;Supervision Gait Distance (Feet): 56 Feet Assistive device: Rolling walker (2 wheeled) Gait Pattern/deviations: Step-to pattern;Step-through pattern;Shuffle;Trunk flexed     General Gait Details: cues for sequence, posture, pace, position from RW and safety awareness   Stairs Stairs: Yes Stairs assistance: Min assist Stair Management: No rails;Step to pattern;Backwards;With walker Number of Stairs: 6 General stair comments: 3 steps twice bkwd with RW and cues for sequence and foot/RW placement   Wheelchair Mobility    Modified Rankin (Stroke Patients Only)       Balance Overall balance assessment: Needs assistance Sitting-balance support: No upper extremity supported;Feet supported Sitting balance-Leahy  Scale: Good     Standing balance support: Bilateral upper extremity supported Standing balance-Leahy Scale: Fair                              Cognition Arousal/Alertness: Awake/alert Behavior During Therapy: WFL for tasks assessed/performed Overall Cognitive Status: Within Functional Limits for tasks assessed                                 General Comments: Patient exhibits some poor attention span and mild forgetfulness.      Exercises      General Comments        Pertinent Vitals/Pain Pain Assessment: 0-10 Pain Score: 4  Pain Location: L hip - with movement Pain Descriptors / Indicators: Aching;Sore;Guarding;Grimacing Pain Intervention(s): Limited activity within patient's tolerance;Monitored during session;Premedicated before session;Ice applied    Home Living                      Prior Function            PT Goals (current goals can now be found in the care plan section) Acute Rehab PT Goals Patient Stated Goal: Regain independence PT Goal Formulation: With patient Potential to Achieve Goals: Good Progress towards PT goals: Progressing toward goals    Frequency    7X/week      PT Plan Current plan remains appropriate    Co-evaluation              AM-PAC PT "6 Clicks" Mobility   Outcome Measure  Help needed  turning from your back to your side while in a flat bed without using bedrails?: A Lot Help needed moving from lying on your back to sitting on the side of a flat bed without using bedrails?: A Little Help needed moving to and from a bed to a chair (including a wheelchair)?: A Little Help needed standing up from a chair using your arms (e.g., wheelchair or bedside chair)?: A Little Help needed to walk in hospital room?: A Little Help needed climbing 3-5 steps with a railing? : A Little 6 Click Score: 17    End of Session Equipment Utilized During Treatment: Gait belt Activity Tolerance: Patient  tolerated treatment well Patient left: in bed;with call bell/phone within reach;with bed alarm set Nurse Communication: Mobility status PT Visit Diagnosis: Muscle weakness (generalized) (M62.81);Difficulty in walking, not elsewhere classified (R26.2);Pain Pain - Right/Left: Left     Time: 1561-5379 PT Time Calculation (min) (ACUTE ONLY): 35 min  Charges:  $Gait Training: 8-22 mins $Therapeutic Activity: 8-22 mins                     Debe Coder PT Acute Rehabilitation Services Pager [phone] Office [phone]    Holly Simpson 09/22/2020, 4:11 PM

## 2020-09-22 NOTE — Evaluation (Signed)
Occupational Therapy Evaluation Patient Details Name: Holly Simpson MRN: [id number] DOB: [birth year] Today's Date: 09/22/2020    History of Present Illness Pt s/p L THR and with hx of back surgery and breast CA   Clinical Impression   Holly Simpson is an 84 year old woman s/p L THR with decreased ROM and strength of LLE, decreased activity tolerance and balance as well as complaints of pain impairing her ability to perform independent and safe mobility and ADLs. Patient requiring assistance of leg lift and bed rails to transfer out of bed, min assist for LB bathing and max assist for LB dressing. Patient provided with education in regards to safe compensatory strategies to use at home - including use of [facility] for toileting and showering. Patient familiar with AE and has LHR, sock aide and shoe horn which she demonstrated use of with therapist provided providing verbal cues for improved technique as needed. Patient did have difficulty donning tennis shoes and therapist loosened laces. Patient reporting she has a high bed and using a foot stool - therapist recommended sleeping in recliner or couch if needed and waiting to perform bed transfer until [facility] therapist arrives. Therapist recommended 24/7 supervision at home which she reports she lined up. Recommend HH OT at discharge for carryover of learning - as patient appears to be mildly forgetful and a little impulsive.    Follow Up Recommendations  Home health OT    Equipment Recommendations  3 in 1 bedside commode    Recommendations for Other Services       Precautions / Restrictions Precautions Precautions: Fall Restrictions Weight Bearing Restrictions: No      Mobility Bed Mobility Overal bed mobility: Needs Assistance Bed Mobility: Supine to Sit     Supine to sit: Modified independent (Device/Increase time);HOB elevated     General bed mobility comments: Patient used bed rail and pseudo leg lift to transfer to side of bed  with supervision - verbal cues for instruction.    Transfers Overall transfer level: Needs assistance Equipment used: Rolling walker (2 wheeled) Transfers: Sit to/from Stand Sit to Stand: Min guard         General transfer comment: Min guard with RW to stand and manuever to recliner. One episode of pain in which patient gasped in pain and taking hands off of walker. Therapist instructed patient that her response to pain is a high fall risk and to keep hands on walker.    Balance Overall balance assessment: Needs assistance Sitting-balance support: No upper extremity supported;Feet supported Sitting balance-Leahy Scale: Good     Standing balance support: Bilateral upper extremity supported Standing balance-Leahy Scale: Fair Standing balance comment: Able to take her hands off of walker to manage clothing - but at times can become unsteady.                           ADL either performed or assessed with clinical judgement   ADL Overall ADL's : Needs assistance/impaired Eating/Feeding: Independent   Grooming: Independent   Upper Body Bathing: Set up;Sitting   Lower Body Bathing: Minimal assistance;Sit to/from stand;Min guard Lower Body Bathing Details (indicate cue type and reason): for lower legs Upper Body Dressing : Set up;Sitting   Lower Body Dressing: Set up;Maximal assistance;Sit to/from stand Lower Body Dressing Details (indicate cue type and reason): Needs assistance for socks, shoes and getting clothing over feet. Toilet Transfer: Designer, television/film set Details (indicate cue type and reason):  verbal cues for technique Toileting- Clothing Manipulation and Hygiene: Min guard;Sit to/from stand         General ADL Comments: Patient instructed on use of AE to improve ability to independently perform ADLs. Patient has a LHR reacher, sock aide and shoe horn at home. Patient demonstrated use and did well to don pants, socks and one shoe. Patient reporting  some dizziness in seated position after pain medication given with leaning over. Discussed use of long handled sponge for home, using BSC in shower for safety and the need for 24/7 supervision at home.     Vision Patient Visual Report: No change from baseline       Perception     Praxis      Pertinent Vitals/Pain Pain Assessment: Faces Faces Pain Scale: Hurts little more Pain Location: L hip - with movement Pain Descriptors / Indicators: Aching;Sore;Guarding;Grimacing Pain Intervention(s): Patient requesting pain meds-RN notified;Limited activity within patient's tolerance     Hand Dominance Right   Extremity/Trunk Assessment Upper Extremity Assessment Upper Extremity Assessment: Overall WFL for tasks assessed   Lower Extremity Assessment Lower Extremity Assessment: Defer to PT evaluation   Cervical / Trunk Assessment Cervical / Trunk Assessment: Normal   Communication Communication Communication: No difficulties   Cognition Arousal/Alertness: Awake/alert Behavior During Therapy: WFL for tasks assessed/performed Overall Cognitive Status: Within Functional Limits for tasks assessed                                 General Comments: Patient exhibits some poor attention span and mild forgetfulness.   General Comments       Exercises     Shoulder Instructions      Home Living Family/patient expects to be discharged to:: Private residence Living Arrangements: Non-relatives/Friends Available Help at Discharge: Available PRN/intermittently Type of Home: House Home Access: Stairs to enter Technical brewer of Steps: 2 Entrance Stairs-Rails: None Home Layout: One level     Bathroom Shower/Tub: Occupational psychologist: Standard     Home Equipment: None          Prior Functioning/Environment Level of Independence: Independent        Comments: Reports she will have friends to stay with her predominantly day and night at  discharge.        OT Problem List: Decreased strength;Decreased range of motion;Decreased activity tolerance;Impaired balance (sitting and/or standing);Decreased knowledge of use of DME or AE;Decreased knowledge of precautions;Pain;Obesity      OT Treatment/Interventions:      OT Goals(Current goals can be found in the care plan section) Acute Rehab OT Goals Patient Stated Goal: Regain independence OT Goal Formulation: All assessment and education complete, DC therapy  OT Frequency:     Barriers to D/C:            Co-evaluation              AM-PAC OT "6 Clicks" Daily Activity     Outcome Measure Help from another person eating meals?: None Help from another person taking care of personal grooming?: None Help from another person toileting, which includes using toliet, bedpan, or urinal?: A Little Help from another person bathing (including washing, rinsing, drying)?: A Little Help from another person to put on and taking off regular upper body clothing?: None Help from another person to put on and taking off regular lower body clothing?: A Lot 6 Click Score: 20   End of  Session Equipment Utilized During Treatment: Gait belt;Rolling walker Nurse Communication: Mobility status (mild complaints of dizziness after pain medication given)  Activity Tolerance: Patient tolerated treatment well Patient left: in chair;with call bell/phone within reach;with chair alarm set  OT Visit Diagnosis: Unsteadiness on feet (R26.81);Other abnormalities of gait and mobility (R26.89);Pain Pain - Right/Left: Left Pain - part of body: Hip                Time: 0820-0900 OT Time Calculation (min): 40 min Charges:  OT General Charges $OT Visit: 1 Visit OT Evaluation $OT Eval Low Complexity: 1 Low OT Treatments $Self Care/Home Management : 23-37 mins  Ahria Slappey, OTR/L Acute Care Rehab Services  Office [phone] Pager: [place] 09/22/2020, 10:01 AM

## 2020-09-23 DIAGNOSIS — M1712 Unilateral primary osteoarthritis, left knee: Secondary | ICD-10-CM | POA: Diagnosis not present

## 2020-09-23 LAB — CBC
HCT: 31.7 % — ABNORMAL LOW (ref 36.0–46.0)
Hemoglobin: 10.3 g/dL — ABNORMAL LOW (ref 12.0–15.0)
MCH: 29.6 pg (ref 26.0–34.0)
MCHC: 32.5 g/dL (ref 30.0–36.0)
MCV: 91.1 fL (ref 80.0–100.0)
Platelets: 193 10*3/uL (ref 150–400)
RBC: 3.48 MIL/uL — ABNORMAL LOW (ref 3.87–5.11)
RDW: 15.9 % — ABNORMAL HIGH (ref 11.5–15.5)
WBC: 9.2 10*3/uL (ref 4.0–10.5)
nRBC: 0 % (ref 0.0–0.2)

## 2020-09-23 LAB — GLUCOSE, CAPILLARY
Glucose-Capillary: 103 mg/dL — ABNORMAL HIGH (ref 70–99)
Glucose-Capillary: 110 mg/dL — ABNORMAL HIGH (ref 70–99)

## 2020-09-23 NOTE — Progress Notes (Signed)
Physical Therapy Treatment Patient Details Name: Holly Simpson MRN: [id number] DOB: Oct 17, 1935 Today's Date: 09/23/2020    History of Present Illness Pt s/p L THR and with hx of back surgery and breast CA    PT Comments    Pt performed basic bed mobility tasks with cues, min guard and use of gait belt to assist.  Pt reviewed car transfers.  Pt c/o fatigue and eager for dc home.   Follow Up Recommendations  Home health PT     Equipment Recommendations  Rolling walker with 5" wheels    Recommendations for Other Services OT consult     Precautions / Restrictions Precautions Precautions: Fall Restrictions Weight Bearing Restrictions: No Other Position/Activity Restrictions: WBAT    Mobility  Bed Mobility Overal bed mobility: Needs Assistance Bed Mobility: Supine to Sit;Sit to Supine     Supine to sit: Min guard Sit to supine: Min guard   General bed mobility comments: Increased time with cues for sequence and use of gait belt to self assist L LE  Transfers Overall transfer level: Needs assistance Equipment used: Rolling walker (2 wheeled) Transfers: Sit to/from [facility] Sit to Stand: Supervision Stand pivot transfers: Supervision       General transfer comment: cues for LE management and use of UEs to self assist  Ambulation/Gait                 Stairs             Wheelchair Mobility    Modified Rankin (Stroke Patients Only)       Balance Overall balance assessment: Needs assistance Sitting-balance support: No upper extremity supported;Feet supported Sitting balance-Leahy Scale: Good     Standing balance support: Bilateral upper extremity supported Standing balance-Leahy Scale: Fair                              Cognition Arousal/Alertness: Awake/alert Behavior During Therapy: WFL for tasks assessed/performed Overall Cognitive Status: Within Functional Limits for tasks assessed                                  General Comments: Patient exhibits some poor attention span and mild forgetfulness.      Exercises      General Comments        Pertinent Vitals/Pain Pain Assessment: 0-10 Pain Score: 4  Pain Location: L hip - with movement Pain Descriptors / Indicators: Aching;Sore;Guarding;Grimacing Pain Intervention(s): Limited activity within patient's tolerance;Monitored during session;Premedicated before session;Ice applied    Home Living                      Prior Function            PT Goals (current goals can now be found in the care plan section) Acute Rehab PT Goals Patient Stated Goal: Regain independence PT Goal Formulation: With patient Time For Goal Achievement: 09/28/20 Potential to Achieve Goals: Good Progress towards PT goals: Progressing toward goals    Frequency    7X/week      PT Plan Current plan remains appropriate    Co-evaluation              AM-PAC PT "6 Clicks" Mobility   Outcome Measure  Help needed turning from your back to your side while in a flat bed without using bedrails?: A Little Help needed  moving from lying on your back to sitting on the side of a flat bed without using bedrails?: A Little Help needed moving to and from a bed to a chair (including a wheelchair)?: A Little Help needed standing up from a chair using your arms (e.g., wheelchair or bedside chair)?: A Little Help needed to walk in hospital room?: A Little Help needed climbing 3-5 steps with a railing? : A Little 6 Click Score: 18    End of Session Equipment Utilized During Treatment: Gait belt Activity Tolerance: Patient tolerated treatment well Patient left: in chair;with call bell/phone within reach;with chair alarm set;with nursing/sitter in room Nurse Communication: Mobility status PT Visit Diagnosis: Muscle weakness (generalized) (M62.81);Difficulty in walking, not elsewhere classified (R26.2);Pain Pain - Right/Left: Left      Time: 1021-1040 PT Time Calculation (min) (ACUTE ONLY): 19 min  Charges:  $Therapeutic Activity: 8-22 mins                     Debe Coder PT Acute Rehabilitation Services Pager [phone] Office [phone]    Greene Diodato 09/23/2020, 1:47 PM

## 2020-09-23 NOTE — Plan of Care (Signed)
  Problem: Education: Goal: Knowledge of General Education information will improve Description: Including pain rating scale, medication(s)/side effects and non-pharmacologic comfort measures Outcome: Progressing   Problem: Activity: Goal: Risk for activity intolerance will decrease Outcome: Progressing   

## 2020-09-23 NOTE — Progress Notes (Signed)
Physical Therapy Treatment Patient Details Name: Holly Simpson MRN: [id number] DOB: January 08, 1936 Today's Date: 09/23/2020    History of Present Illness Pt s/p L THR and with hx of back surgery and breast CA    PT Comments    Pt continues very cooperative and progressing steadily with mobility but limited by fatigue.  Pt up to ambulate in hall and negotiate stairs - written instruction provided.   Follow Up Recommendations  Home health PT     Equipment Recommendations  Rolling walker with 5" wheels    Recommendations for Other Services OT consult     Precautions / Restrictions Precautions Precautions: Fall Restrictions Weight Bearing Restrictions: No Other Position/Activity Restrictions: WBAT    Mobility  Bed Mobility               General bed mobility comments: Pt up in chair and requests back to same for bfast  Transfers Overall transfer level: Needs assistance Equipment used: Rolling walker (2 wheeled) Transfers: Sit to/from Stand Sit to Stand: Supervision         General transfer comment: cues for LE management and use of UEs to self assist  Ambulation/Gait Ambulation/Gait assistance: Min guard;Supervision Gait Distance (Feet): 120 Feet Assistive device: Rolling walker (2 wheeled) Gait Pattern/deviations: Step-to pattern;Shuffle;Decreased step length - left Gait velocity: cues to slow down for safety   General Gait Details: cues for sequence, posture, pace, position from RW and safety awareness   Stairs Stairs: Yes Stairs assistance: Min assist Stair Management: No rails;Step to pattern;Backwards;With walker Number of Stairs: 3 General stair comments: 3 steps bkwd with RW and cues for sequence and foot/RW placement   Wheelchair Mobility    Modified Rankin (Stroke Patients Only)       Balance Overall balance assessment: Needs assistance Sitting-balance support: No upper extremity supported;Feet supported Sitting balance-Leahy  Scale: Good     Standing balance support: Bilateral upper extremity supported Standing balance-Leahy Scale: Fair Standing balance comment: Able to take her hands off of walker to manage clothing - but at times can become unsteady.                            Cognition Arousal/Alertness: Awake/alert Behavior During Therapy: WFL for tasks assessed/performed Overall Cognitive Status: Within Functional Limits for tasks assessed                                 General Comments: Patient exhibits some poor attention span and mild forgetfulness.      Exercises      General Comments        Pertinent Vitals/Pain Pain Assessment: 0-10 Pain Score: 4  Pain Location: L hip - with movement Pain Descriptors / Indicators: Aching;Sore;Guarding;Grimacing Pain Intervention(s): Limited activity within patient's tolerance;Monitored during session;Premedicated before session;Ice applied    Home Living                      Prior Function            PT Goals (current goals can now be found in the care plan section) Acute Rehab PT Goals Patient Stated Goal: Regain independence PT Goal Formulation: With patient Time For Goal Achievement: 09/28/20 Potential to Achieve Goals: Good Progress towards PT goals: Progressing toward goals    Frequency    7X/week      PT Plan Current plan remains appropriate  Co-evaluation              AM-PAC PT "6 Clicks" Mobility   Outcome Measure  Help needed turning from your back to your side while in a flat bed without using bedrails?: A Lot Help needed moving from lying on your back to sitting on the side of a flat bed without using bedrails?: A Little Help needed moving to and from a bed to a chair (including a wheelchair)?: A Little Help needed standing up from a chair using your arms (e.g., wheelchair or bedside chair)?: A Little Help needed to walk in hospital room?: A Little Help needed climbing 3-5  steps with a railing? : A Little 6 Click Score: 17    End of Session Equipment Utilized During Treatment: Gait belt Activity Tolerance: Patient tolerated treatment well Patient left: in chair;with call bell/phone within reach;with chair alarm set;with nursing/sitter in room Nurse Communication: Mobility status PT Visit Diagnosis: Muscle weakness (generalized) (M62.81);Difficulty in walking, not elsewhere classified (R26.2);Pain Pain - Right/Left: Left     Time: 7948-0165 PT Time Calculation (min) (ACUTE ONLY): 25 min  Charges:  $Gait Training: 23-37 mins                     [place] Pager [phone] Office [phone]    Bartholomew Ramesh 09/23/2020, 9:33 AM

## 2020-09-23 NOTE — Progress Notes (Signed)
   Subjective: 3 Days Post-Op Procedure(s) (LRB): TOTAL HIP ARTHROPLASTY ANTERIOR APPROACH (Left) Patient reports pain as mild.   Patient seen in rounds for Dr. Lyla Glassing. Patient is well, and has had no acute complaints or problems other than difficulty sleeping last night. States she is ready to go home. Positive BM last night. Denies chest pain or SOB. Plan is to go Home after hospital stay.  Objective: Vital signs in last 24 hours: Temp:  [97.6 F (36.4 C)-97.9 F (36.6 C)] 97.9 F (36.6 C) (12/19 0547) Pulse Rate:  [67-73] 67 (12/19 0547) Resp:  [16-18] 16 (12/19 0547) BP: (125-144)/(61-78) 141/78 (12/19 0547) SpO2:  [97 %-98 %] 97 % (12/19 0547)  Intake/Output from previous day:  Intake/Output Summary (Last 24 hours) at 09/23/2020 0755 Last data filed at 09/23/2020 0600 Gross per 24 hour  Intake 960 ml  Output 1200 ml  Net -240 ml    Intake/Output this shift: No intake/output data recorded.  Labs: Recent Labs    09/21/20 0329 09/22/20 0254 09/23/20 0251  HGB 11.2* 10.5* 10.3*   Recent Labs    09/22/20 0254 09/23/20 0251  WBC 10.3 9.2  RBC 3.53* 3.48*  HCT 32.0* 31.7*  PLT 200 193   Recent Labs    09/21/20 0329  NA 130*  K 4.3  CL 97*  CO2 22  BUN 19  CREATININE 1.03*  GLUCOSE 136*  CALCIUM 8.3*   No results for input(s): LABPT, INR in the last 72 hours.  Exam: General - Patient is Alert and Oriented Extremity - Neurologically intact Neurovascular intact Sensation intact distally Dorsiflexion/Plantar flexion intact Dressing/Incision - clean, dry, no drainage Motor Function - intact, moving foot and toes well on exam.   Past Medical History:  Diagnosis Date  . Arthritis   . Asthma   . Cancer ([place])    skin ankle,breast  . Hypertension   . Pre-diabetes     Assessment/Plan: 3 Days Post-Op Procedure(s) (LRB): TOTAL HIP ARTHROPLASTY ANTERIOR APPROACH (Left) Principal Problem:   Osteoarthritis of left hip  Estimated body mass index  is 37.89 kg/m as calculated from the following:   Height as of this encounter: 5' (1.524 m).   Weight as of this encounter: 88 kg. Up with therapy  DVT Prophylaxis - Aspirin Weight-bearing as tolerated  Plan for discharge to home with HHPT once cleared by physical therapy. Follow-up in the office in 2 weeks with Dr. Lyla Glassing.  Theresa Duty, PA-C Orthopedic Surgery [phone] 09/23/2020, 7:55 AM

## 2020-09-27 NOTE — Discharge Summary (Signed)
Physician Discharge Summary  Patient ID: Holly Simpson MRN: [id number] DOB/AGE: [birth year] 84 y.o.  Admit date: 09/20/2020 Discharge date: 09/27/2020  Admission Diagnoses:  Osteoarthritis of left hip  Discharge Diagnoses:  Principal Problem:   Osteoarthritis of left hip   Past Medical History:  Diagnosis Date  . Arthritis   . Asthma   . Cancer ([place])    skin ankle,breast  . Hypertension   . Pre-diabetes     Surgeries: Procedure(s): TOTAL HIP ARTHROPLASTY ANTERIOR APPROACH on 09/20/2020   Consultants (if any):   Discharged Condition: Improved  Hospital Course: Holly Simpson is an 84 y.o. female who was admitted 09/20/2020 with a diagnosis of Osteoarthritis of left hip and went to the operating room on 09/20/2020 and underwent the above named procedures.    She was given perioperative antibiotics:  Anti-infectives (From admission, onward)   Start     Dose/Rate Route Frequency Ordered Stop   09/20/20 1330  ceFAZolin (ANCEF) IVPB 2g/100 mL premix        2 g 200 mL/hr over 30 Minutes Intravenous Every 6 hours 09/20/20 1230 09/20/20 1809   09/20/20 0600  ceFAZolin (ANCEF) IVPB 2g/100 mL premix        2 g 200 mL/hr over 30 Minutes Intravenous On call to O.R. 09/20/20 [id number] 09/20/20 0739    .  She was given sequential compression devices, early ambulation, and aspirin for DVT prophylaxis.  She benefited maximally from the hospital stay and there were no complications.    Recent vital signs:  Vitals:   09/22/20 2204 09/23/20 0547  BP: (!) 144/74 (!) 141/78  Pulse: 73 67  Resp: 17 16  Temp: 97.6 F (36.4 C) 97.9 F (36.6 C)  SpO2: 98% 97%    Recent laboratory studies:  Lab Results  Component Value Date   HGB 10.3 (L) 09/23/2020   HGB 10.5 (L) 09/22/2020   HGB 11.2 (L) 09/21/2020   Lab Results  Component Value Date   WBC 9.2 09/23/2020   PLT 193 09/23/2020   Lab Results  Component Value Date   INR 1.0 09/14/2020   Lab Results  Component  Value Date   NA 130 (L) 09/21/2020   K 4.3 09/21/2020   CL 97 (L) 09/21/2020   CO2 22 09/21/2020   BUN 19 09/21/2020   CREATININE 1.03 (H) 09/21/2020   GLUCOSE 136 (H) 09/21/2020    Discharge Medications:   Allergies as of 09/23/2020   No Known Allergies     Medication List    STOP taking these medications   aspirin EC 81 MG tablet Replaced by: aspirin 81 MG chewable tablet     TAKE these medications   amLODipine 5 MG tablet Commonly known as: NORVASC Take 5 mg by mouth daily.   anastrozole 1 MG tablet Commonly known as: ARIMIDEX Take 1 tablet (1 mg total) by mouth daily.   aspirin 81 MG chewable tablet Chew 1 tablet (81 mg total) by mouth 2 (two) times daily. Replaces: aspirin EC 81 MG tablet   docusate sodium 100 MG capsule Commonly known as: COLACE Take 1 capsule (100 mg total) by mouth 2 (two) times daily.   famotidine 20 MG tablet Commonly known as: PEPCID Take 10 mg by mouth daily.   Fish Oil 1000 MG Caps Take 1,000 mg by mouth daily.   furosemide 20 MG tablet Commonly known as: LASIX Take 20 mg by mouth daily.   glipiZIDE 2.5 MG 24 hr tablet Commonly known as: GLUCOTROL XL  Take 2.5 mg by mouth daily with breakfast.   HYDROcodone-acetaminophen 7.5-325 MG tablet Commonly known as: NORCO Take 1-2 tablets by mouth every 4 (four) hours as needed for severe pain (pain score 7-10).   levothyroxine 125 MCG tablet Commonly known as: SYNTHROID Take 125 mcg by mouth daily before breakfast.   lisinopril 10 MG tablet Commonly known as: ZESTRIL Take 10 mg by mouth daily.   methocarbamol 500 MG tablet Commonly known as: Robaxin Take 1 tablet (500 mg total) by mouth 3 (three) times daily as needed for muscle spasms.   multivitamin with minerals tablet Take 1 tablet by mouth daily. Woman 50 +   ondansetron 4 MG tablet Commonly known as: ZOFRAN Take 1 tablet (4 mg total) by mouth every 6 (six) hours as needed for nausea.   senna 8.6 MG Tabs  tablet Commonly known as: SENOKOT Take 2 tablets (17.2 mg total) by mouth at bedtime.   Vitamin D (Ergocalciferol) 1.25 MG (50000 UNIT) Caps capsule Commonly known as: DRISDOL Take 50,000 Units by mouth every 7 (seven) days.            Discharge Care Instructions  (From admission, onward)         Start     Ordered   09/23/20 0000  Weight bearing as tolerated        09/23/20 0759   09/23/20 0000  Change dressing       Comments: You have an adhesive waterproof bandage over the incision. Leave this in place until your first follow-up appointment. Once you remove this you will not need to place another bandage.   09/23/20 0759          Diagnostic Studies: DG Pelvis Portable  Result Date: 09/20/2020 CLINICAL DATA:  Post LEFT hip surgery EXAM: PORTABLE PELVIS 1-2 VIEWS COMPARISON:  Portable exam 1126 hours compared intraoperative images of 09/20/2020 FINDINGS: LEFT hip prosthesis in expected position. No fracture, dislocation, or bone destruction. Bones demineralized. Joint space narrowing and degenerative changes of the RIGHT hip joint. IMPRESSION: LEFT hip prosthesis without acute complication. Electronically Signed   By: Lavonia Dana M.D.   On: 09/20/2020 11:45   DG C-Arm 1-60 Min-No Report  Result Date: 09/20/2020 Fluoroscopy was utilized by the requesting physician.  No radiographic interpretation.   DG HIP OPERATIVE UNILAT W OR W/O PELVIS LEFT  Result Date: 09/20/2020 CLINICAL DATA:  Left total hip replacement EXAM: OPERATIVE LEFT HIP   1 VIEW TECHNIQUE: Fluoroscopic spot image(s) were submitted for interpretation post-operatively. COMPARISON:  None. 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 Subsequent frontal images show total hip replacement on the left with prosthetic components well-seated on frontal view. No fracture or dislocation. There is mild narrowing of the right hip joint. IMPRESSION: Total hip replacement on the  left with prosthetic components well-seated on frontal view. No fracture or dislocation. Narrowing right hip joint noted. Electronically Signed   By: Lowella Grip III M.D.   On: 09/20/2020 09:43    Disposition: Discharge disposition: 01-Home or Self Care     Dental Antibiotics:  In most cases prophylactic antibiotics for Dental procdeures after total joint surgery are not necessary.  Exceptions are as follows:  1. History of prior total joint infection  2. Severely immunocompromised (Organ Transplant, cancer chemotherapy, Rheumatoid biologic meds such as [place])  3. Poorly controlled diabetes (A1C &gt; 8.0, blood glucose over 200)  If you have one of these conditions, contact your surgeon for an antibiotic prescription,  prior to your dental procedure.  Discharge Instructions    Call MD / Call 911   Complete by: As directed    If you experience chest pain or shortness of breath, CALL 911 and be transported to the hospital emergency room.  If you develope a fever above 101 F, pus (white drainage) or increased drainage or redness at the wound, or calf pain, call your surgeon's office.   Change dressing   Complete by: As directed    You have an adhesive waterproof bandage over the incision. Leave this in place until your first follow-up appointment. Once you remove this you will not need to place another bandage.   Constipation Prevention   Complete by: As directed    Drink plenty of fluids.  Prune juice may be helpful.  You may use a stool softener, such as Colace (over the counter) 100 mg twice a day.  Use MiraLax (over the counter) for constipation as needed.   Diet - low sodium heart healthy   Complete by: As directed    Do not sit on low chairs, stoools or toilet seats, as it may be difficult to get up from low surfaces   Complete by: As directed    Driving restrictions   Complete by: As directed    No driving for two weeks   Face-to-face encounter (required for  Medicare/Medicaid patients)   Complete by: As directed    I Darrick Grinder certify that this patient is under my care and that I, or a nurse practitioner or physician's assistant working with me, had a face-to-face encounter that meets the physician face-to-face encounter requirements with this patient on 09/21/2020. The encounter with the patient was in whole, or in part for the following medical condition(s) which is the primary reason for home health care (List medical condition): Osteoarthritis of left hip. Status post left total hip arthroplasty   The encounter with the patient was in whole, or in part, for the following medical condition, which is the primary reason for home health care: Status post left total hip arthroplasty   I certify that, based on my findings, the following services are medically necessary home health services: Physical therapy   Reason for Medically Necessary Home Health Services: Therapy- Therapeutic Exercises to Increase Strength and Endurance   My clinical findings support the need for the above services: Pain interferes with ambulation/mobility   Further, I certify that my clinical findings support that this patient is homebound due to: Pain interferes with ambulation/mobility   Home Health   Complete by: As directed    To provide the following care/treatments: PT   TED hose   Complete by: As directed    Use stockings (TED hose) for three weeks on both leg(s).  You may remove them at night for sleeping.   Weight bearing as tolerated   Complete by: As directed        Follow-up Information    S[place], Arlys John, MD. Schedule an appointment as soon as possible for a visit in 2 weeks.   Specialty: Orthopedic Surgery Why: For wound re-check Contact information: [address] [place] [address] [place] [address] [phone]        Care, [facility] Follow up.   Specialty: Home Health Services Why: to provide home health physical therapy Contact  information: [address] [place] [address] [phone]                Signed: Darrick Grinder  09/27/2020, 12:10 PM

## 2020-10-10 ENCOUNTER — Encounter: Payer: Self-pay | Admitting: Oncology

## 2021-01-15 DIAGNOSIS — H35033 Hypertensive retinopathy, bilateral: Secondary | ICD-10-CM | POA: Insufficient documentation

## 2021-01-15 DIAGNOSIS — H353131 Nonexudative age-related macular degeneration, bilateral, early dry stage: Secondary | ICD-10-CM | POA: Insufficient documentation

## 2021-01-28 ENCOUNTER — Telehealth: Payer: Self-pay | Admitting: Oncology

## 2021-01-28 NOTE — Telephone Encounter (Signed)
Rescheduled per provider. Called pt and left a msg 

## 2021-02-05 ENCOUNTER — Ambulatory Visit: Payer: Medicare PPO | Admitting: Adult Health

## 2021-02-07 ENCOUNTER — Ambulatory Visit: Payer: Medicare PPO | Admitting: Adult Health

## 2021-03-21 ENCOUNTER — Encounter: Payer: Self-pay | Admitting: Oncology

## 2021-03-25 ENCOUNTER — Encounter: Payer: Self-pay | Admitting: Adult Health

## 2021-03-25 ENCOUNTER — Inpatient Hospital Stay: Payer: Medicare PPO | Attending: Adult Health | Admitting: Adult Health

## 2021-03-25 ENCOUNTER — Other Ambulatory Visit: Payer: Self-pay

## 2021-03-25 VITALS — BP 175/62 | HR 77 | Temp 97.7°F | Resp 13 | Ht 60.0 in | Wt 176.1 lb

## 2021-03-25 DIAGNOSIS — M8580 Other specified disorders of bone density and structure, unspecified site: Secondary | ICD-10-CM | POA: Insufficient documentation

## 2021-03-25 DIAGNOSIS — Z87891 Personal history of nicotine dependence: Secondary | ICD-10-CM | POA: Insufficient documentation

## 2021-03-25 DIAGNOSIS — M8588 Other specified disorders of bone density and structure, other site: Secondary | ICD-10-CM | POA: Insufficient documentation

## 2021-03-25 DIAGNOSIS — Z79899 Other long term (current) drug therapy: Secondary | ICD-10-CM | POA: Insufficient documentation

## 2021-03-25 DIAGNOSIS — D0511 Intraductal carcinoma in situ of right breast: Secondary | ICD-10-CM | POA: Diagnosis not present

## 2021-03-25 NOTE — Progress Notes (Signed)
[place]  [phone] [phone]) [phone]     ID: Holly Simpson DOB: [birth year]  MR#: [id number]  KZS#:[id number]  Patient Care Team: [name] as PCP - General (Physician Assistant) Mauro Kaufmann, RN as Oncology Nurse Navigator Rockwell Germany, RN as Oncology Nurse Navigator Rolm Bookbinder, MD as Consulting Physician (General Surgery) Magrinat, Virgie Dad, MD as Consulting Physician (Oncology) Eppie Gibson, MD as Attending Physician (Radiation Oncology) Magnus Sinning, MD as Consulting Physician (Physical Medicine and Rehabilitation) Ria Clock, MD as Attending Physician (Radiology) Scot Dock, NP OTHER MD:  CHIEF COMPLAINT: estrogen receptor positive noninvasive breast cancer  CURRENT TREATMENT: Anastrozole; intensified screening   INTERVAL HISTORY: Holly Simpson returns today for follow up of her noninvasive breast cancer unaccompanied.  Holly Simpson opted to bypass surgery and radiation and start on anastrozole. She tolerates this well with no issues such as arthralgias, hot flashes, or vaginal dryness.    She underwent mammogram on 03/06/2021 at [facility] that showed stable DCIS, repeat mammogram was recommended to be completed in 09/2021.  She also underwent a bone density evaluation at that time that showed osteopenia with a t score of -2.2.  Holly Simpson is overall doing well.  She has no new health concerns, and remains active.     REVIEW OF SYSTEMS: Review of Systems  Constitutional:  Negative for appetite change, chills, fatigue, fever and unexpected weight change.  HENT:   Negative for hearing loss, lump/mass and trouble swallowing.   Eyes:  Negative for eye problems and icterus.  Respiratory:  Negative for chest tightness, cough and shortness of breath.   Cardiovascular:  Negative for chest pain, leg swelling and palpitations.  Gastrointestinal:  Negative for abdominal distention, abdominal pain, constipation,  diarrhea, nausea and vomiting.  Endocrine: Negative for hot flashes.  Genitourinary:  Negative for difficulty urinating.   Musculoskeletal:  Negative for arthralgias.  Skin:  Negative for itching and rash.  Neurological:  Negative for dizziness, extremity weakness, headaches and numbness.  Hematological:  Negative for adenopathy. Does not bruise/bleed easily.  Psychiatric/Behavioral:  Negative for depression. The patient is not nervous/anxious.      HISTORY OF CURRENT ILLNESS: From the original intake note:  "Holly Simpson" herself palpated a left breast lump, as well as noted associated tenderness. She underwent bilateral diagnostic mammography with tomography and right breast ultrasonography at [facility] on 01/26/2020 showing: breast density category C; indeterminate 2 cm calcifications in linear distribution within right lower medial breast; possible 6 mm mass along anterior aspect of right lower medial calcifications; no abnormalities were seen sonographically in the right breast or axilla.  Accordingly on 02/13/2020 she proceeded to biopsy of the right breast calcifications in question. The pathology from this procedure ([id number]) showed: ductal carcinoma in situ, intermediate grade. Prognostic indicators significant for: estrogen receptor, 100% positive and progesterone receptor, 90% positive, both with strong staining intensity.  The patient's subsequent history is as detailed below.   PAST MEDICAL HISTORY: Past Medical History:  Diagnosis Date   Arthritis    Asthma    Cancer ([place])    skin ankle,breast   Hypertension    Pre-diabetes     PAST SURGICAL HISTORY: Past Surgical History:  Procedure Laterality Date   APPENDECTOMY     BACK SURGERY     L3-4   CHOLECYSTECTOMY     TONSILLECTOMY     TOTAL HIP ARTHROPLASTY Left 09/20/2020   Procedure: TOTAL HIP ARTHROPLASTY ANTERIOR APPROACH;  Surgeon: Rod Can, MD;  Location: [facility];  Service: Orthopedics;  Laterality: Left;    triguer finguer      She has a history of skin cancer removal from her right ankle and left ear.   FAMILY HISTORY: History reviewed. No pertinent family history.  Her father died at age 34 and her mother at age 90, both from heart issues. She has one brother and one sister. There is no family history of cancer to her knowledge.   GYNECOLOGIC HISTORY:  No LMP recorded. Patient is postmenopausal. Menarche: [age] years old [place] P 0 LMP around age 71 Contraceptive: never used HRT: never used  Hysterectomy? no BSO? 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  She is single lives by herself, with no pets.    ADVANCED DIRECTIVES: The patient has named Holly Simpson as her healthcare power of attorney.  She can be reached at [phone].   HEALTH MAINTENANCE: Social History   Tobacco Use   Smoking status: Former    Pack years: 0.00   Smokeless tobacco: Never   Tobacco comments:    Quit 20 years ago  Vaping Use   Vaping Use: Never used  Substance Use Topics   Alcohol use: Yes    Comment: occas.   Drug use: Never     Colonoscopy: date unsure, repeat not indicated  PAP: "years" ago  Bone density: date unsure   No Known Allergies  Current Outpatient Medications  Medication Sig Dispense Refill   amLODipine (NORVASC) 5 MG tablet Take 5 mg by mouth daily.      anastrozole (ARIMIDEX) 1 MG tablet Take 1 tablet (1 mg total) by mouth daily. 90 tablet 4   docusate sodium (COLACE) 100 MG capsule Take 1 capsule (100 mg total) by mouth 2 (two) times daily. 120 capsule 0   famotidine (PEPCID) 20 MG tablet Take 10 mg by mouth daily.      furosemide (LASIX) 20 MG tablet Take 20 mg by mouth daily.      glipiZIDE (GLUCOTROL XL) 2.5 MG 24 hr tablet Take 2.5 mg by mouth daily with breakfast.      HYDROcodone-acetaminophen (NORCO) 7.5-325 MG tablet Take 1-2 tablets by mouth every 4 (four) hours as needed for severe  pain (pain score 7-10). 40 tablet 0   levothyroxine (SYNTHROID) 125 MCG tablet Take 125 mcg by mouth daily before breakfast.      lisinopril (ZESTRIL) 10 MG tablet Take 10 mg by mouth daily.      methocarbamol (ROBAXIN) 500 MG tablet Take 1 tablet (500 mg total) by mouth 3 (three) times daily as needed for muscle spasms. 30 tablet 1   Multiple Vitamins-Minerals (MULTIVITAMIN WITH MINERALS) tablet Take 1 tablet by mouth daily. Woman 50 +     Omega-3 Fatty Acids (FISH OIL) 1000 MG CAPS Take 1,000 mg by mouth daily.      ondansetron (ZOFRAN) 4 MG tablet Take 1 tablet (4 mg total) by mouth every 6 (six) hours as needed for nausea. 20 tablet 0   senna (SENOKOT) 8.6 MG TABS tablet Take 2 tablets (17.2 mg total) by mouth at bedtime. 120 tablet 0   Vitamin D, Ergocalciferol, (DRISDOL) 1.25 MG (50000 UNIT) CAPS capsule Take 50,000 Units by mouth every 7 (seven) days.      No current facility-administered medications for this visit.    OBJECTIVE:  Vitals:   03/25/21 1037  BP: (!) 175/62  Pulse: 77  Resp: 13  Temp:  97.7 F (36.5 C)  SpO2: 100%     Body mass index is 34.39 kg/m.   Wt Readings from Last 3 Encounters:  03/25/21 176 lb 1.6 oz (79.9 kg)  09/20/20 194 lb (88 kg)  09/14/20 173 lb (78.5 kg)      ECOG FS:1 - Symptomatic but completely ambulatory  GENERAL: Patient is a well appearing female in no acute distress HEENT:  Sclerae anicteric.  Oropharynx clear and moist. No ulcerations or evidence of oropharyngeal candidiasis. Neck is supple.  NODES:  No cervical, supraclavicular, or axillary lymphadenopathy palpated.  BREAST EXAM:  no masses noted in either breast, benign breast exam.  LUNGS:  Clear to auscultation bilaterally.  No wheezes or rhonchi. HEART:  Regular rate and rhythm. No murmur appreciated. ABDOMEN:  Soft, nontender.  Positive, normoactive bowel sounds. No organomegaly palpated. MSK:  No focal spinal tenderness to palpation. Full range of motion bilaterally in the upper  extremities. EXTREMITIES:  No peripheral edema.   SKIN:  Clear with no obvious rashes or skin changes. No nail dyscrasia. NEURO:  Nonfocal. Well oriented.  Appropriate affect.   LAB RESULTS:  CMP     Component Value Date/Time   NA 130 (L) 09/21/2020 0329   K 4.3 09/21/2020 0329   CL 97 (L) 09/21/2020 0329   CO2 22 09/21/2020 0329   GLUCOSE 136 (H) 09/21/2020 0329   BUN 19 09/21/2020 0329   CREATININE 1.03 (H) 09/21/2020 0329   CREATININE 1.49 (H) 02/22/2020 1221   CALCIUM 8.3 (L) 09/21/2020 0329   PROT 7.9 09/14/2020 1205   ALBUMIN 4.4 09/14/2020 1205   AST 22 09/14/2020 1205   AST 21 02/22/2020 1221   ALT 16 09/14/2020 1205   ALT 19 02/22/2020 1221   ALKPHOS 73 09/14/2020 1205   BILITOT 0.6 09/14/2020 1205   BILITOT 0.7 02/22/2020 1221   GFRNONAA 54 (L) 09/21/2020 0329   GFRNONAA 32 (L) 02/22/2020 1221   GFRAA 38 (L) 02/22/2020 1221    No results found for: TOTALPROTELP, ALBUMINELP, A1GS, A2GS, BETS, BETA2SER, GAMS, MSPIKE, SPEI  Lab Results  Component Value Date   WBC 9.2 09/23/2020   NEUTROABS 5.1 02/22/2020   HGB 10.3 (L) 09/23/2020   HCT 31.7 (L) 09/23/2020   MCV 91.1 09/23/2020   PLT 193 09/23/2020    No results found for: LABCA2  No components found for: VELFYB017  No results for input(s): INR in the last 168 hours.  No results found for: LABCA2  No results found for: PZW258  No results found for: NID782  No results found for: UMP536  No results found for: CA2729  No components found for: HGQUANT  No results found for: CEA1 / No results found for: CEA1   No results found for: AFPTUMOR  No results found for: CHROMOGRNA  No results found for: KPAFRELGTCHN, LAMBDASER, KAPLAMBRATIO (kappa/lambda light chains)  No results found for: HGBA, HGBA2QUANT, HGBFQUANT, HGBSQUAN (Hemoglobinopathy evaluation)   No results found for: LDH  No results found for: IRON, TIBC, IRONPCTSAT (Iron and TIBC)  No results found for: FERRITIN  Urinalysis     Component Value Date/Time   COLORURINE YELLOW 09/14/2020 [place] 09/14/2020 1205   LABSPEC 1.020 09/14/2020 1205   PHURINE 5.0 09/14/2020 [place] 09/14/2020 1205   HGBUR NEGATIVE 09/14/2020 1205   BILIRUBINUR NEGATIVE 09/14/2020 1205   KETONESUR 5 (A) 09/14/2020 [place] 09/14/2020 1205   NITRITE NEGATIVE 09/14/2020 1205   LEUKOCYTESUR MODERATE (A)  09/14/2020 1205     STUDIES: No results found.    ELIGIBLE FOR AVAILABLE RESEARCH PROTOCOL: Decided against COMET trial  ASSESSMENT: 85 y.o. [place], [place] woman status post left breast biopsy 02/13/2020 for ductal carcinoma in situ, grade 2, strongly estrogen and progesterone receptor positive  (a) the patient originally palpated a mass, subsequent ultrasound did not document a mass  (1) patient opted for intensified screening with no surgery or radiation (and opted against COMET)  (a) mammography every June  (b) breast MRI every December  (2) anastrozole started 02/22/2020  (a) bone density scan at [facility] 03/2021 that showes osteopenia with a t score of -2.2  PLAN: Holly Simpson is here today for follow up of her left breast DCIS currently undergoing treatment with Anastrozole and intensified screening instead of surgery or radiation.  She continues on Anastrozole with good tolerance and she was recommended to take this daily.  Her most recent mammogram shows stable calcification size.  She will return in 6 months for breast MRI which I placed orders for today.  Holly Simpson agrees with the plan.  Bone density testing shows osteopenia, closer to osteoporosis.  She and I reviewed calcium, vitamin d and weight bearing exercises.  WE also discussed prolia to improve her bone density.  She and I discussed this in detail, and reviewed risks versus benefits.  She and I reviewed taking extra calcium on the day of injection, and discussed the risk including but not limited to osteonecrosis of the jaw.   She sees her dentist regularly and has no current dental concerns.  She knows to let them be aware she is receiving this when she sees them again.    Holly Simpson will return in about a week for labs and injection, and again in November for f/u with Dr. Jana Hakim.  She knows to call for any questions that may arise between now and her next appointment.  We are happy to see her sooner if needed.  Total encounter time: 30 minutes* in chart review, lab review, face to face visit time, order entry, and documentation of the encounter.    Wilber Bihari, NP 03/25/21 10:45 AM Medical Oncology and Hematology [facility] [place], [place] [address] Tel. [phone]    Fax. [phone]   *Total Encounter Time as defined by the Centers for Medicare and Medicaid Services includes, in addition to the face-to-face time of a patient visit (documented in the note above) non-face-to-face time: obtaining and reviewing outside history, ordering and reviewing medications, tests or procedures, care coordination (communications with other health care professionals or caregivers) and documentation in the medical record.

## 2021-03-26 ENCOUNTER — Telehealth: Payer: Self-pay | Admitting: Adult Health

## 2021-03-26 NOTE — Telephone Encounter (Signed)
Scheduled appointment per 06/21 los. Left message.

## 2021-03-27 ENCOUNTER — Other Ambulatory Visit: Payer: Self-pay | Admitting: Adult Health

## 2021-03-27 NOTE — Progress Notes (Signed)
Received message that Prolia is non preferred injection for the patient and her insurance company prefers Zometa.  Her creatinine clearance is calculated at 44.  I attempted to call her to review the change and did not get an answer.  Will attempt again.    Wilber Bihari, NP

## 2021-03-28 ENCOUNTER — Telehealth: Payer: Self-pay | Admitting: Adult Health

## 2021-03-28 ENCOUNTER — Ambulatory Visit
Admission: RE | Admit: 2021-03-28 | Discharge: 2021-03-28 | Disposition: A | Discharge status: Home / Self Care | Payer: Medicare PPO | Source: Ambulatory Visit | Attending: Adult Health | Admitting: Adult Health

## 2021-03-28 DIAGNOSIS — D0511 Intraductal carcinoma in situ of right breast: Secondary | ICD-10-CM

## 2021-03-28 IMAGING — MR MR BREAST BILAT WO/W CM
8 of 12 series · 30 of 48 positions shown · IV contrast (8 ml gadavist)
Comparison: Previous exam(s).

CLINICAL DATA: 85-year-old female with biopsy-proven right breast
DCIS diagnosed in [Q2] undergoing surveillance.

LABS:  None.
EXAM:
BILATERAL BREAST MRI WITH AND WITHOUT CONTRAST
TECHNIQUE: Multiplanar, multisequence MR images of both breasts were obtained
prior to and following the intravenous administration of 8 ml of
Gadavist

[Series 2: t2_tirm_tra ipat (a-p) · axial · 3.0mm · 0.70mm/px · 1 of 63 slices shown]
[im 1/63]
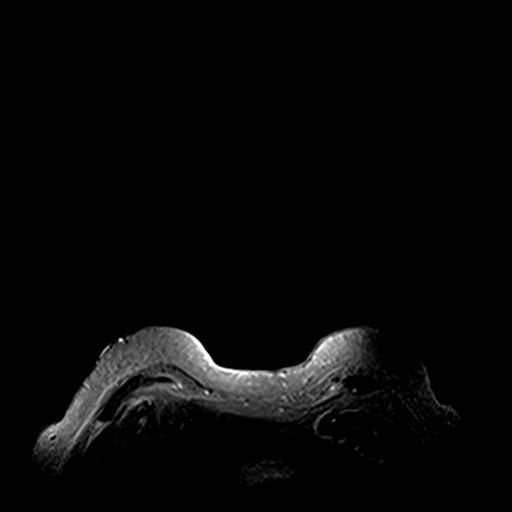

[Series 3: fl3d pre-cm no · axial · non-contrast · 0.9mm · 0.94mm/px · z∈[-96,+76]mm · 4 of 178 slices shown]
[im 1/178]
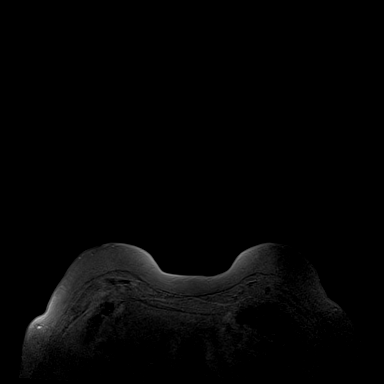
[im 60/178]
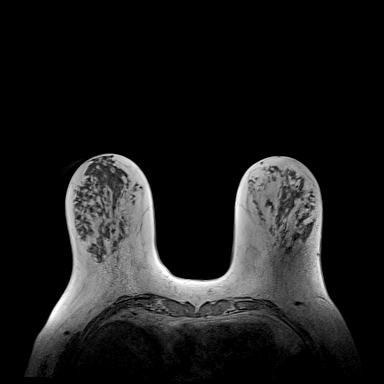
[im 119/178]
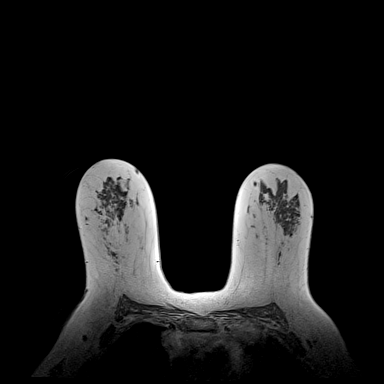
[im 178/178]
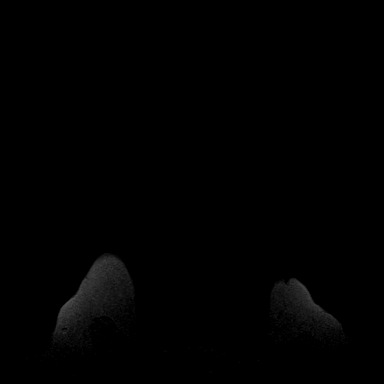

[Series 4: fl3d pre-cm · axial · non-contrast · 0.9mm · 0.87mm/px · z∈[-96,+76]mm · 5 of 192 slices shown]
[im 1/192]
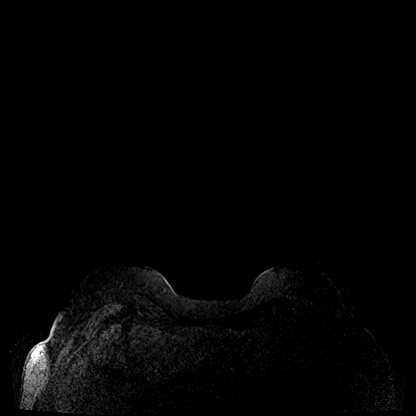
[im 48/192]
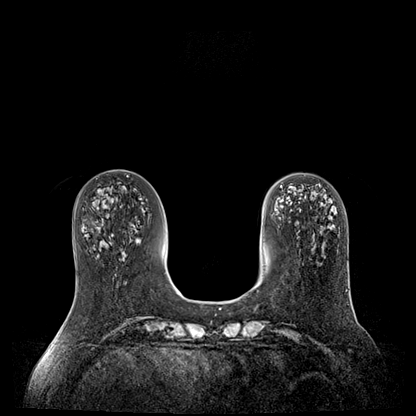
[im 96/192]
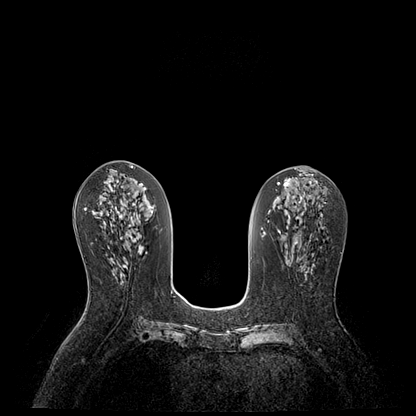
[im 144/192]
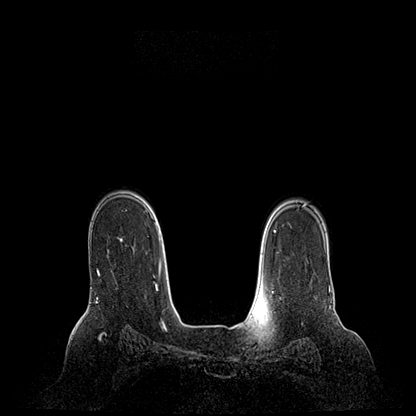
[im 192/192]
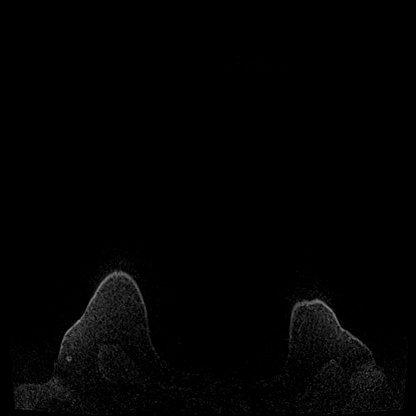

[Series 5: fl3d post-cm 20 · axial · 0.9mm · 0.87mm/px · z∈[-96,+76]mm · 5 of 192 slices shown (1 of 3)]
[im 1/192]
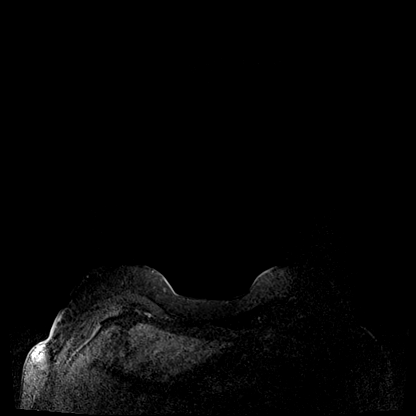
[im 48/192]
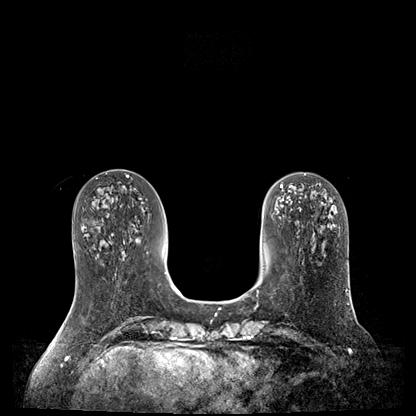
[im 96/192]
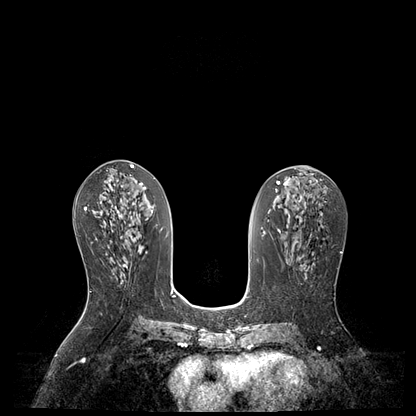
[im 144/192]
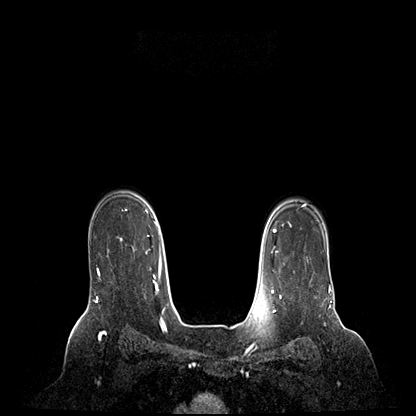
[im 192/192]
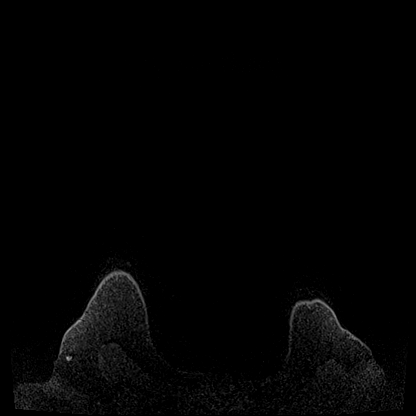

[Series 6: fl3d post-cm 20 · axial · 0.9mm · 0.87mm/px · z∈[-96,+76]mm · 6 of 192 slices shown (2 of 3)]
[im 1/192]
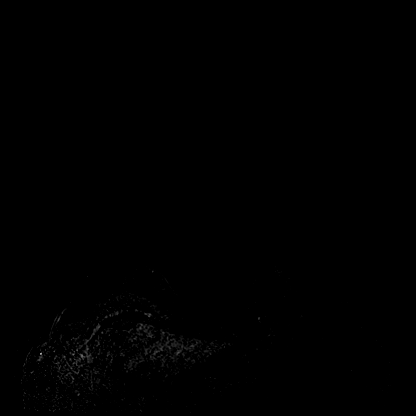
[im 39/192]
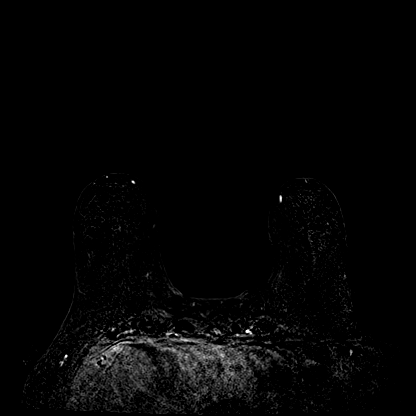
[im 77/192]
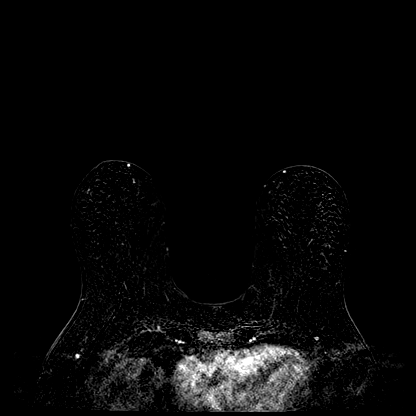
[im 115/192]
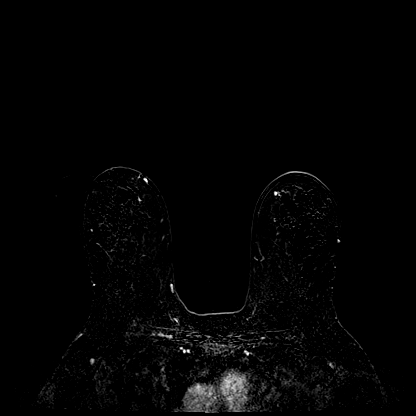
[im 153/192]
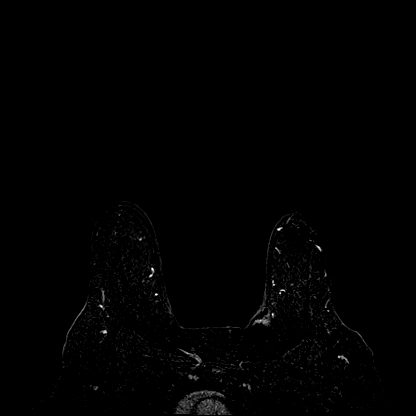
[im 192/192]
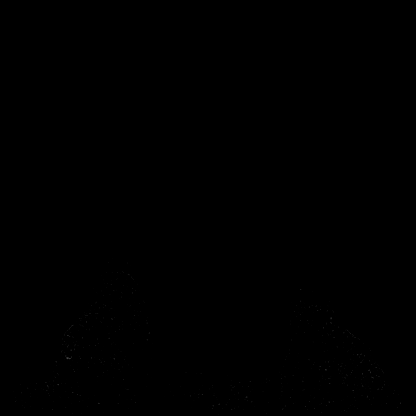

[Series 7: fl3d post-cm 20 · axial · 172.8mm · 0.87mm/px · 1 of 1 slices shown (3 of 3)]
[im 1/1]
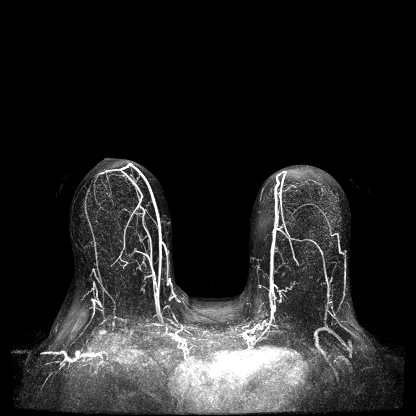

[Series 8: fl3d post-cm 3min · axial · 0.9mm · 0.87mm/px · z∈[-96,+76]mm · 6 of 192 slices shown]
[im 1/192]
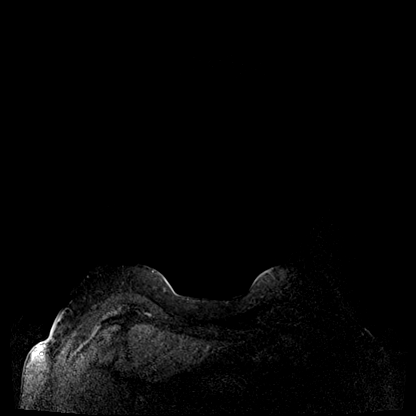
[im 39/192]
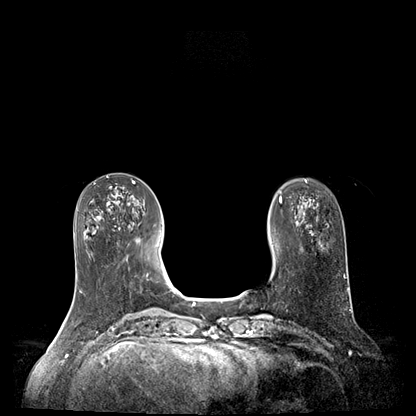
[im 77/192]
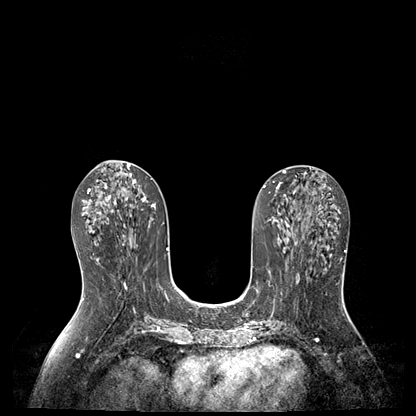
[im 115/192]
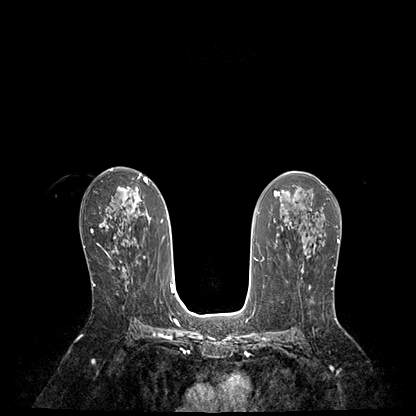
[im 153/192]
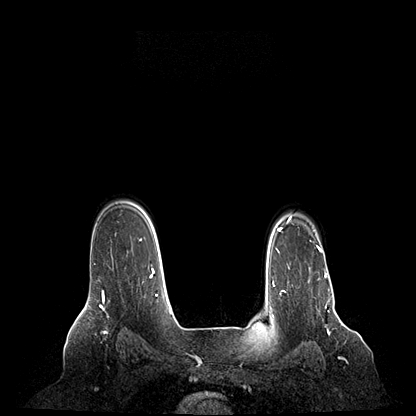
[im 192/192]
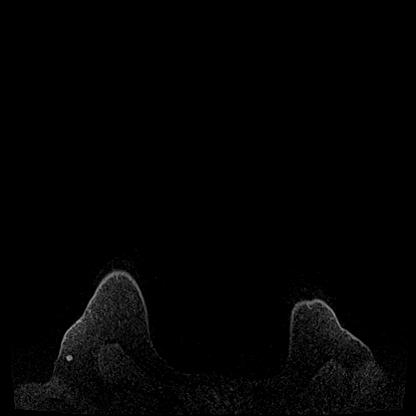

[Series 9: fl3d post-cm 3min_sub · axial · 0.9mm · 0.87mm/px · z∈[-96,-62]mm · 2 of 192 slices shown]
[im 1/192]
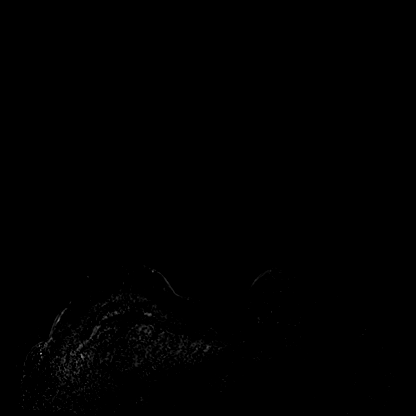
[im 39/192]
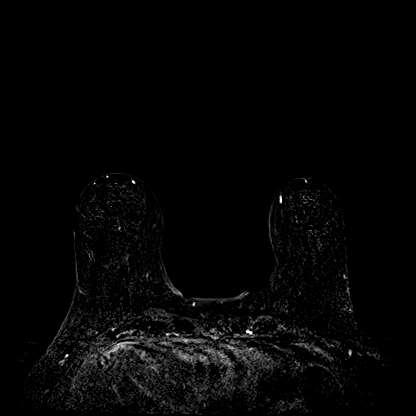

[30 of 48 positions shown; findings below may reference images not displayed]

Three-dimensional MR images were rendered by post-processing of the
original MR data on an independent workstation. The
three-dimensional MR images were interpreted, and findings are
reported in the following complete MRI report for this study. Three
dimensional images were evaluated at the independent interpreting
workstation using the DynaCAD thin client.
FINDINGS: Breast composition: c. Heterogeneous fibroglandular tissue.

Background parenchymal enhancement: Mild

Right breast: There is susceptibility artifact at the site of biopsy
proven DCIS in the lower inner right breast. There is an adjacent
enhancing mass measuring 0.7 cm, not significantly changed from
prior. There is no new suspicious enhancement elsewhere in the right
breast.

Left breast: No mass or abnormal enhancement.

Lymph nodes: No abnormal appearing lymph nodes.

Ancillary findings:  None.
IMPRESSION: 1. Stable 0.7 cm enhancing mass in the lower inner right breast at
the site of biopsy-proven DCIS.
2. No MRI evidence of malignancy in the left breast.

RECOMMENDATION:
Continue treatment plan for known right breast cancer.

BI-RADS CATEGORY  6: Known biopsy-proven malignancy.

## 2021-03-28 MED ORDER — GADOBUTROL 1 MMOL/ML IV SOLN
8.0000 mL | Freq: Once | INTRAVENOUS | Status: AC | PRN
  Administered 2021-03-28: 8 mL via INTRAVENOUS

## 2021-03-28 NOTE — Telephone Encounter (Signed)
Called patient to review her MRI results and discuss her receiving Zometa instead of Prolia due to her insurance.  I LMOM for her to call me back.    Wilber Bihari, NP

## 2021-03-28 NOTE — Telephone Encounter (Signed)
I called Holly Simpson and reviewed her MRI results with her.  I also reviewed the fact that her insurance company wants her to receive Zometa instead of Prolia.  I reviewed this with [facility] our pharmacist and she double checked her creatinine clearance.  I shared this with Holly Simpson, and the fact that we will see her once a year with her Zometa and she understands this.    I sent a high priority schedule message and asked our scheduler to call the patient with her updated appointment information.    Wilber Bihari, NP

## 2021-03-29 ENCOUNTER — Other Ambulatory Visit: Payer: Self-pay | Admitting: *Deleted

## 2021-03-29 ENCOUNTER — Other Ambulatory Visit: Payer: Self-pay | Admitting: Adult Health

## 2021-03-29 DIAGNOSIS — M8588 Other specified disorders of bone density and structure, other site: Secondary | ICD-10-CM

## 2021-03-29 DIAGNOSIS — D0511 Intraductal carcinoma in situ of right breast: Secondary | ICD-10-CM

## 2021-03-29 NOTE — Progress Notes (Signed)
Per [facility] in pharmacy we do not carry Reclast, and the Zometa 4mg  needs to be entered instead.  I changed the order.  Will get labs on the day of treatment to verify creatinine clearance and calcium level.   I discussed the change from Prolia to Zometa with [name] yesterday who understands and is in agreement to proceed with the change.  Wilber Bihari, NP

## 2021-04-01 ENCOUNTER — Inpatient Hospital Stay: Payer: Medicare PPO

## 2021-04-01 ENCOUNTER — Other Ambulatory Visit: Payer: Self-pay

## 2021-04-01 DIAGNOSIS — D0511 Intraductal carcinoma in situ of right breast: Secondary | ICD-10-CM | POA: Diagnosis not present

## 2021-04-01 LAB — CMP (CANCER CENTER ONLY)
ALT: 13 U/L (ref 0–44)
AST: 19 U/L (ref 15–41)
Albumin: 4 g/dL (ref 3.5–5.0)
Alkaline Phosphatase: 80 U/L (ref 38–126)
Anion gap: 14 (ref 5–15)
BUN: 23 mg/dL (ref 8–23)
CO2: 24 mmol/L (ref 22–32)
Calcium: 9.2 mg/dL (ref 8.9–10.3)
Chloride: 96 mmol/L — ABNORMAL LOW (ref 98–111)
Creatinine: 1.8 mg/dL — ABNORMAL HIGH (ref 0.44–1.00)
GFR, Estimated: 28 mL/min — ABNORMAL LOW (ref >60)
Glucose, Bld: 157 mg/dL — ABNORMAL HIGH (ref 70–99)
Potassium: 4.2 mmol/L (ref 3.5–5.1)
Sodium: 134 mmol/L — ABNORMAL LOW (ref 135–145)
Total Bilirubin: 0.6 mg/dL (ref 0.3–1.2)
Total Protein: 7.5 g/dL (ref 6.5–8.1)

## 2021-04-01 LAB — CBC WITH DIFFERENTIAL (CANCER CENTER ONLY)
Abs Immature Granulocytes: 0.02 10*3/uL (ref 0.00–0.07)
Basophils Absolute: 0.1 10*3/uL (ref 0.0–0.1)
Basophils Relative: 1 %
Eosinophils Absolute: 0.4 10*3/uL (ref 0.0–0.5)
Eosinophils Relative: 4 %
HCT: 39.1 % (ref 36.0–46.0)
Hemoglobin: 12.8 g/dL (ref 12.0–15.0)
Immature Granulocytes: 0 %
Lymphocytes Relative: 24 %
Lymphs Abs: 2 10*3/uL (ref 0.7–4.0)
MCH: 26.9 pg (ref 26.0–34.0)
MCHC: 32.7 g/dL (ref 30.0–36.0)
MCV: 82.1 fL (ref 80.0–100.0)
Monocytes Absolute: 0.8 10*3/uL (ref 0.1–1.0)
Monocytes Relative: 10 %
Neutro Abs: 5.1 10*3/uL (ref 1.7–7.7)
Neutrophils Relative %: 61 %
Platelet Count: 286 10*3/uL (ref 150–400)
RBC: 4.76 MIL/uL (ref 3.87–5.11)
RDW: 18 % — ABNORMAL HIGH (ref 11.5–15.5)
WBC Count: 8.3 10*3/uL (ref 4.0–10.5)
nRBC: 0 % (ref 0.0–0.2)

## 2021-04-01 NOTE — Progress Notes (Signed)
Due to low creatinine clearance, patient was not cleared to receive Zometa infusion today. Wilber Bihari, NP aware. Delice Bison would prefer patient to receive Prolia infusion as opposed to Zometa. Patient is to be discharged and rescheduled following peer-to-peer review to insurance company for treatment change. Patient aware of the situation.

## 2021-04-17 ENCOUNTER — Telehealth: Payer: Self-pay | Admitting: *Deleted

## 2021-04-17 NOTE — Telephone Encounter (Signed)
This RN received VM from the patient asking " am I still supposed to get that shot ?"  Pt was to get Prolia- but insurance stated zometa under benefit- when pt came in to receive zometa- noted creatinine was elevated and zometa not advised.  Request needed for Prolia to be authorized due to creatinine level.  Pt states " I thought I got a letter from my insurance saying it ( prolia )was approved ".  This RN will follow up with PA dept per above and notify pt to schedule.  Note pt states she may not be available tomorrow by phone and requested message to be left.

## 2021-04-19 ENCOUNTER — Telehealth: Payer: Self-pay | Admitting: *Deleted

## 2021-04-19 ENCOUNTER — Telehealth: Payer: Self-pay | Admitting: Adult Health

## 2021-04-19 NOTE — Telephone Encounter (Signed)
Obtained authorization for Prolia for osteoporosis due to elevated creatinine with zometa not medically advised.  Sent LOS for scheduling and called pt to inform- message left on her VM.

## 2021-04-19 NOTE — Telephone Encounter (Signed)
Scheduled appointment per 07/15 sch msg. Left message.

## 2021-04-25 ENCOUNTER — Other Ambulatory Visit: Payer: Self-pay

## 2021-04-25 ENCOUNTER — Inpatient Hospital Stay: Payer: Medicare PPO | Attending: Adult Health

## 2021-04-25 ENCOUNTER — Other Ambulatory Visit: Payer: Self-pay | Admitting: Adult Health

## 2021-04-25 VITALS — BP 133/70 | HR 72 | Temp 97.7°F | Resp 15

## 2021-04-25 DIAGNOSIS — Z79899 Other long term (current) drug therapy: Secondary | ICD-10-CM | POA: Diagnosis not present

## 2021-04-25 DIAGNOSIS — M8588 Other specified disorders of bone density and structure, other site: Secondary | ICD-10-CM | POA: Insufficient documentation

## 2021-04-25 DIAGNOSIS — D0511 Intraductal carcinoma in situ of right breast: Secondary | ICD-10-CM | POA: Diagnosis present

## 2021-04-25 MED ORDER — DENOSUMAB 60 MG/ML ~~LOC~~ SOSY
PREFILLED_SYRINGE | SUBCUTANEOUS | Status: AC
  Filled 2021-04-25: qty 1

## 2021-04-25 MED ORDER — DENOSUMAB 60 MG/ML ~~LOC~~ SOSY
60.0000 mg | PREFILLED_SYRINGE | Freq: Once | SUBCUTANEOUS | Status: AC
  Administered 2021-04-25: 60 mg via SUBCUTANEOUS

## 2021-04-25 NOTE — Patient Instructions (Addendum)
Denosumab injection What is this medication? DENOSUMAB (den oh sue mab) slows bone breakdown. Prolia is used to treat osteoporosis in women after menopause and in men, and in people who are taking corticosteroids for 6 months or more. Delton See is used to treat a high calcium level due to cancer and to prevent bone fractures and other bone problems caused by multiple myeloma or cancer bone metastases. Delton See is also used totreat giant cell tumor of the bone. This medicine may be used for other purposes; ask your health care provider orpharmacist if you have questions. COMMON BRAND NAME(S): Prolia, XGEVA What should I tell my care team before I take this medication? They need to know if you have any of these conditions: dental disease having surgery or tooth extraction infection kidney disease low levels of calcium or Vitamin D in the blood malnutrition on hemodialysis skin conditions or sensitivity thyroid or parathyroid disease an unusual reaction to denosumab, other medicines, foods, dyes, or preservatives pregnant or trying to get pregnant breast-feeding How should I use this medication? This medicine is for injection under the skin. It is given by a health careprofessional in a hospital or clinic setting. A special MedGuide will be given to you before each treatment. Be sure to readthis information carefully each time. For Prolia, talk to your pediatrician regarding the use of this medicine in children. Special care may be needed. For Delton See, talk to your pediatrician regarding the use of this medicine in children. While this drug may be prescribed for children as young as 13 years for selected conditions,precautions do apply. Overdosage: If you think you have taken too much of this medicine contact apoison control center or emergency room at once. NOTE: This medicine is only for you. Do not share this medicine with others. What if I miss a dose? It is important not to miss your dose. Call  your doctor or health careprofessional if you are unable to keep an appointment. What may interact with this medication? Do not take this medicine with any of the following medications: other medicines containing denosumab This medicine may also interact with the following medications: medicines that lower your chance of fighting infection steroid medicines like prednisone or cortisone This list may not describe all possible interactions. Give your health care provider a list of all the medicines, herbs, non-prescription drugs, or dietary supplements you use. Also tell them if you smoke, drink alcohol, or use illegaldrugs. Some items may interact with your medicine. What should I watch for while using this medication? Visit your doctor or health care professional for regular checks on your progress. Your doctor or health care professional may order blood tests andother tests to see how you are doing. Call your doctor or health care professional for advice if you get a fever, chills or sore throat, or other symptoms of a cold or flu. Do not treat yourself. This drug may decrease your body's ability to fight infection. Try toavoid being around people who are sick. You should make sure you get enough calcium and vitamin D while you are taking this medicine, unless your doctor tells you not to. Discuss the foods you eatand the vitamins you take with your health care professional. See your dentist regularly. Brush and floss your teeth as directed. Before youhave any dental work done, tell your dentist you are receiving this medicine. Do not become pregnant while taking this medicine or for 5 months after stopping it. Talk with your doctor or health care professional about your  birth control options while taking this medicine. Women should inform their doctor if they wish to become pregnant or think they might be pregnant. There is a potential for serious side effects to an unborn child. Talk to your health  careprofessional or pharmacist for more information. What side effects may I notice from receiving this medication? Side effects that you should report to your doctor or health care professionalas soon as possible: allergic reactions like skin rash, itching or hives, swelling of the face, lips, or tongue bone pain breathing problems dizziness jaw pain, especially after dental work redness, blistering, peeling of the skin signs and symptoms of infection like fever or chills; cough; sore throat; pain or trouble passing urine signs of low calcium like fast heartbeat, muscle cramps or muscle pain; pain, tingling, numbness in the hands or feet; seizures unusual bleeding or bruising unusually weak or tired Side effects that usually do not require medical attention (report to yourdoctor or health care professional if they continue or are bothersome): constipation diarrhea headache joint pain loss of appetite muscle pain runny nose tiredness upset stomach This list may not describe all possible side effects. Call your doctor for medical advice about side effects. You may report side effects to FDA at1-800-FDA-1088. Where should I keep my medication? This medicine is only given in a clinic, doctor's office, or other health caresetting and will not be stored at home. NOTE: This sheet is a summary. It may not cover all possible information. If you have questions about this medicine, talk to your doctor, pharmacist, orhealth care provider.  2022 Elsevier/Gold Standard (2018-01-29 16:10:44) Denosumab injection What is this medication? DENOSUMAB (den oh sue mab) slows bone breakdown. Prolia is used to treat osteoporosis in women after menopause and in men, and in people who are taking corticosteroids for 6 months or more. Delton See is used to treat a high calcium level due to cancer and to prevent bone fractures and other bone problems caused by multiple myeloma or cancer bone metastases. Delton See is also  used totreat giant cell tumor of the bone. This medicine may be used for other purposes; ask your health care provider orpharmacist if you have questions. COMMON BRAND NAME(S): Prolia, XGEVA What should I tell my care team before I take this medication? They need to know if you have any of these conditions: dental disease having surgery or tooth extraction infection kidney disease low levels of calcium or Vitamin D in the blood malnutrition on hemodialysis skin conditions or sensitivity thyroid or parathyroid disease an unusual reaction to denosumab, other medicines, foods, dyes, or preservatives pregnant or trying to get pregnant breast-feeding How should I use this medication? This medicine is for injection under the skin. It is given by a health careprofessional in a hospital or clinic setting. A special MedGuide will be given to you before each treatment. Be sure to readthis information carefully each time. For Prolia, talk to your pediatrician regarding the use of this medicine in children. Special care may be needed. For Delton See, talk to your pediatrician regarding the use of this medicine in children. While this drug may be prescribed for children as young as 13 years for selected conditions,precautions do apply. Overdosage: If you think you have taken too much of this medicine contact apoison control center or emergency room at once. NOTE: This medicine is only for you. Do not share this medicine with others. What if I miss a dose? It is important not to miss your dose. Call your doctor or  health careprofessional if you are unable to keep an appointment. What may interact with this medication? Do not take this medicine with any of the following medications: other medicines containing denosumab This medicine may also interact with the following medications: medicines that lower your chance of fighting infection steroid medicines like prednisone or cortisone This list may not  describe all possible interactions. Give your health care provider a list of all the medicines, herbs, non-prescription drugs, or dietary supplements you use. Also tell them if you smoke, drink alcohol, or use illegaldrugs. Some items may interact with your medicine. What should I watch for while using this medication? Visit your doctor or health care professional for regular checks on your progress. Your doctor or health care professional may order blood tests andother tests to see how you are doing. Call your doctor or health care professional for advice if you get a fever, chills or sore throat, or other symptoms of a cold or flu. Do not treat yourself. This drug may decrease your body's ability to fight infection. Try toavoid being around people who are sick. You should make sure you get enough calcium and vitamin D while you are taking this medicine, unless your doctor tells you not to. Discuss the foods you eatand the vitamins you take with your health care professional. See your dentist regularly. Brush and floss your teeth as directed. Before youhave any dental work done, tell your dentist you are receiving this medicine. Do not become pregnant while taking this medicine or for 5 months after stopping it. Talk with your doctor or health care professional about your birth control options while taking this medicine. Women should inform their doctor if they wish to become pregnant or think they might be pregnant. There is a potential for serious side effects to an unborn child. Talk to your health careprofessional or pharmacist for more information. What side effects may I notice from receiving this medication? Side effects that you should report to your doctor or health care professionalas soon as possible: allergic reactions like skin rash, itching or hives, swelling of the face, lips, or tongue bone pain breathing problems dizziness jaw pain, especially after dental work redness, blistering,  peeling of the skin signs and symptoms of infection like fever or chills; cough; sore throat; pain or trouble passing urine signs of low calcium like fast heartbeat, muscle cramps or muscle pain; pain, tingling, numbness in the hands or feet; seizures unusual bleeding or bruising unusually weak or tired Side effects that usually do not require medical attention (report to yourdoctor or health care professional if they continue or are bothersome): constipation diarrhea headache joint pain loss of appetite muscle pain runny nose tiredness upset stomach This list may not describe all possible side effects. Call your doctor for medical advice about side effects. You may report side effects to FDA at1-800-FDA-1088. Where should I keep my medication? This medicine is only given in a clinic, doctor's office, or other health caresetting and will not be stored at home. NOTE: This sheet is a summary. It may not cover all possible information. If you have questions about this medicine, talk to your doctor, pharmacist, orhealth care provider.  2022 Elsevier/Gold Standard (2018-01-29 16:10:44)

## 2021-04-25 NOTE — Progress Notes (Signed)
Prolia orders place.  Rems paperwork already given.  Wilber Bihari, NP

## 2021-05-27 ENCOUNTER — Other Ambulatory Visit: Payer: Self-pay | Admitting: Oncology

## 2021-07-17 ENCOUNTER — Encounter: Payer: Self-pay | Admitting: Orthopaedic Surgery

## 2021-07-17 ENCOUNTER — Other Ambulatory Visit: Payer: Self-pay

## 2021-07-17 ENCOUNTER — Ambulatory Visit (INDEPENDENT_AMBULATORY_CARE_PROVIDER_SITE_OTHER): Payer: Medicare PPO | Admitting: Orthopaedic Surgery

## 2021-07-17 ENCOUNTER — Ambulatory Visit: Payer: Self-pay

## 2021-07-17 VITALS — Ht 60.0 in | Wt 178.0 lb

## 2021-07-17 DIAGNOSIS — M25551 Pain in right hip: Secondary | ICD-10-CM | POA: Diagnosis not present

## 2021-07-17 DIAGNOSIS — M4807 Spinal stenosis, lumbosacral region: Secondary | ICD-10-CM

## 2021-07-17 DIAGNOSIS — G8929 Other chronic pain: Secondary | ICD-10-CM

## 2021-07-17 DIAGNOSIS — M5441 Lumbago with sciatica, right side: Secondary | ICD-10-CM | POA: Diagnosis not present

## 2021-07-17 NOTE — Progress Notes (Signed)
Office Visit Note   Patient: Holly Simpson           Date of Birth: April 04, 1936           MRN: [id number] Visit Date: 07/17/2021              Requested by: Earnie Larsson, PA-C [id number] [place],  [place] [id number] PCP: Earnie Larsson, PA-C   Assessment & Plan: Visit Diagnoses:  1. Pain in right hip   2. Chronic right-sided low back pain with right-sided sciatica     Plan: Based on my exam, this does not seem to be a right hip issue and seems to be more of a right-sided SI joint issue versus a radicular problem coming from the lumbar spine.  She has severe degenerative disc disease between L3 and L4 above her fusion level.  This could be causing nerve compression that she is feeling that radiates into the thigh.  A MRI is warranted the lumbar spine with contrast to evaluate for nerve compression based on my clinical exam findings and her signs and symptoms.  She agrees with this treatment plan.  We will see her back once we have the MRI of the lumbar spine.  Follow-Up Instructions: No follow-ups on file.   Orders:  Orders Placed This Encounter  Procedures   XR HIP UNILAT W OR W/O PELVIS 1V RIGHT   No orders of the defined types were placed in this encounter.     Procedures: No procedures performed   Clinical Data: No additional findings.   Subjective: Chief Complaint  Patient presents with   Right Hip - Pain  The patient is a very pleasant 85 year old female who I am seeing for the first time but she is actually been seen in our office before by Dr. Ernestina Patches.  She has a remote history of spinal surgery of the lower lumbar spine with a fusion of L4-L5.  She also had severe arthritis in her left hip and had a left anterior hip replacement done by one of my colleagues in town last December.  That is been less than a year and she is very pleased with her hip replacement left side and he did a very good job.  She has been having some right hip pain and a  friend of hers told her to come see me for her right hip.  However she describes pain in the posterior pelvis that wraps around to her thigh.  She denies any groin pain on the right side.  She denies any numbness and tingling in her feet but this pain has been slowly getting worse for her.  HPI  Review of Systems She currently denies any headache, chest pain, shortness of breath, fever, chills, nausea, vomiting  Objective: Vital Signs: Ht 5' (1.524 m)   Wt 178 lb (80.7 kg)   BMI 34.76 kg/m   Physical Exam She is alert and orient x3 and in no acute distress Ortho Exam Examination of her right hip shows it moves smoothly and fluidly with no blocks to rotation and no pain on my exam in the groin with compression of the hip or the extremes of rotation of the right hip.  Her pain seems to be in the posterior pelvis and the SI joint area to the right side and does radiate into the anterior thigh down to the knee on the right side. Specialty Comments:  No specialty comments available.  Imaging: XR HIP UNILAT  W OR W/O PELVIS 1V RIGHT  Result Date: 07/17/2021 An AP pelvis and lateral right hip shows a left total hip arthroplasty that is well-seated.  The right hip does have mild to moderate fatty changes with joint space narrowing and peritubular osteophytes.    PMFS History: Patient Active Problem List   Diagnosis Date Noted   Osteopenia of spine 03/25/2021   Osteoarthritis of left hip 09/20/2020   Ductal carcinoma in situ (DCIS) of right breast 02/16/2020   H/O acute pancreatitis 11/30/2019   High cholesterol 11/30/2019   Hyponatremia 03/16/2019   Prediabetes 09/10/2018   Hyperglycemia 05/10/2018   Pain in both lower legs 01/06/2018   Carpal tunnel syndrome of right wrist 10/09/2017   Numbness and tingling in right hand 04/29/2017   Encounter for health maintenance examination 05/06/2016   Impaired fasting glucose 05/06/2016   Nuclear sclerotic cataract of left eye 04/03/2016    Age-related nuclear cataract of both eyes 10/28/2015   Chronic kidney disease, stage III (moderate) ([place]) 10/28/2015   Dermatochalasis of both upper eyelids 10/28/2015   GERD without esophagitis 10/28/2015   Hearing loss 10/28/2015   High risk medication use 10/28/2015   HSV-1 infection 10/28/2015   Hyperkalemia 10/28/2015   Keratoconjunctivitis sicca of both eyes not specified as Sjogren's 10/28/2015   Neck pain 10/28/2015   Class 2 severe obesity due to excess calories with serious comorbidity and body mass index (BMI) of 35.0 to 35.9 in adult [facility]) 10/28/2015   Osteoarthritis 10/28/2015   Vitreous floaters of right eye 10/28/2015   High blood pressure 06/19/2014   Hypothyroidism 06/19/2014   Primary osteoarthritis of right knee 06/19/2014   Vitamin D deficiency 06/19/2014   Past Medical History:  Diagnosis Date   Arthritis    Asthma    Cancer ([place])    skin ankle,breast   Hypertension    Pre-diabetes     History reviewed. No pertinent family history.  Past Surgical History:  Procedure Laterality Date   APPENDECTOMY     BACK SURGERY     L3-4   CHOLECYSTECTOMY     TONSILLECTOMY     TOTAL HIP ARTHROPLASTY Left 09/20/2020   Procedure: TOTAL HIP ARTHROPLASTY ANTERIOR APPROACH;  Surgeon: Rod Can, MD;  Location: [facility];  Service: Orthopedics;  Laterality: Left;   triguer finguer     Social History   Occupational History   Not on file  Tobacco Use   Smoking status: Former   Smokeless tobacco: Never   Tobacco comments:    Quit 20 years ago  Vaping Use   Vaping Use: Never used  Substance and Sexual Activity   Alcohol use: Yes    Comment: occas.   Drug use: Never   Sexual activity: Not on file

## 2021-07-24 ENCOUNTER — Telehealth: Payer: Self-pay | Admitting: Orthopaedic Surgery

## 2021-08-03 ENCOUNTER — Ambulatory Visit
Admission: RE | Admit: 2021-08-03 | Discharge: 2021-08-03 | Disposition: A | Discharge status: Home / Self Care | Payer: Medicare PPO | Source: Ambulatory Visit | Attending: Orthopaedic Surgery | Admitting: Orthopaedic Surgery

## 2021-08-03 DIAGNOSIS — M4807 Spinal stenosis, lumbosacral region: Secondary | ICD-10-CM

## 2021-08-03 IMAGING — MR MR LUMBAR SPINE W/O CM
4 of 5 series · 25 of 48 positions shown · non-contrast
Comparison: Lumbar radiographs [DATE]. CT Abdomen and Pelvis
[DATE].

CLINICAL DATA: 85-year-old female with persistent low back pain for
7 months. Right leg pain. Prior hip replacement.

EXAM:
MRI LUMBAR SPINE WITHOUT CONTRAST
TECHNIQUE: Multiplanar, multisequence MR imaging of the lumbar spine was
performed. No intravenous contrast was administered.

[Series 2: T2 · sagittal · 4.0mm · 1.09mm/px · 6 of 15 slices shown (1 of 2)]
[im 1/15]
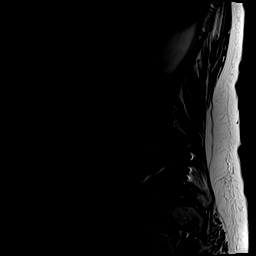
[im 3/15]
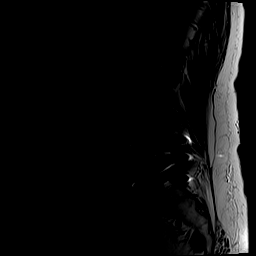
[im 6/15]
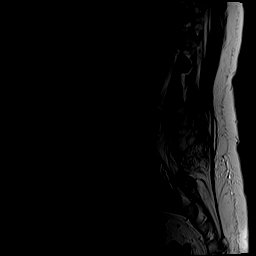
[im 9/15]
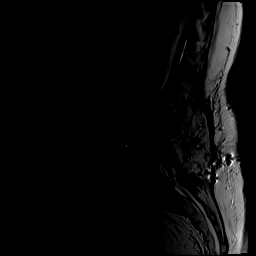
[im 12/15]
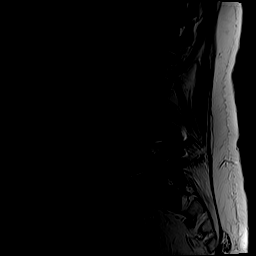
[im 15/15]
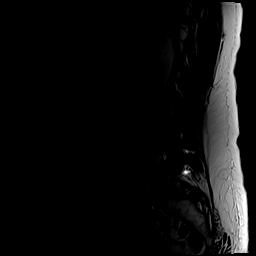

[Series 4: T1 · sagittal · 4.0mm · 1.09mm/px · 5 of 15 slices shown (1 of 2)]
[im 1/15]
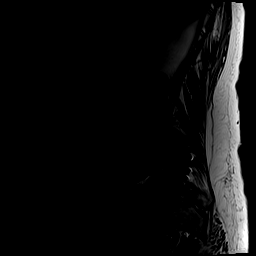
[im 4/15]
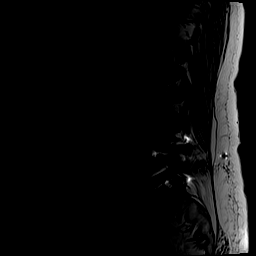
[im 8/15]
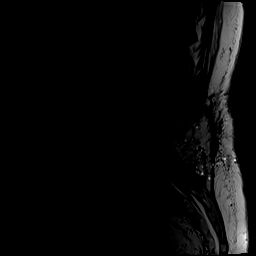
[im 11/15]
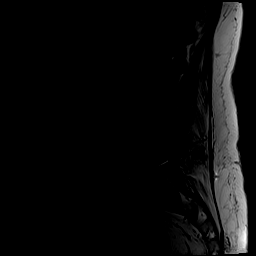
[im 15/15]
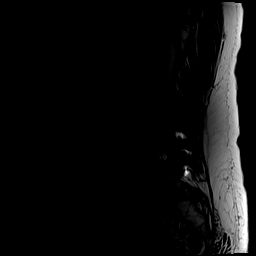

[Series 5: T2 · axial · 4.0mm · 0.39mm/px · z∈[-72,+149]mm · 10 of 44 slices shown (2 of 2)]
[im 3/44]
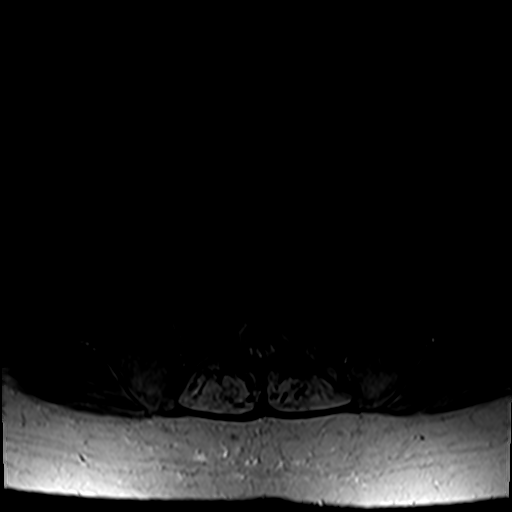
[im 6/44]
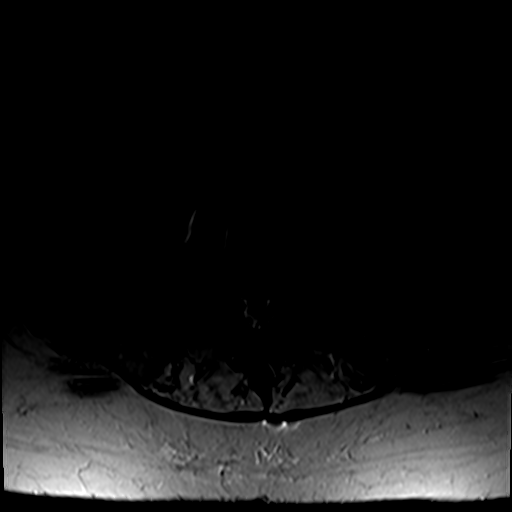
[im 9/44]
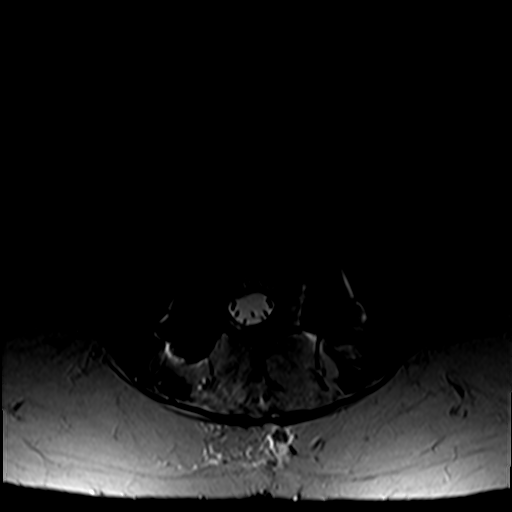
[im 15/44]
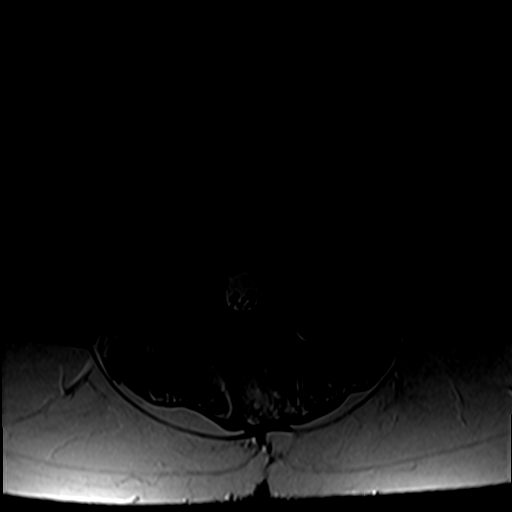
[im 21/44]
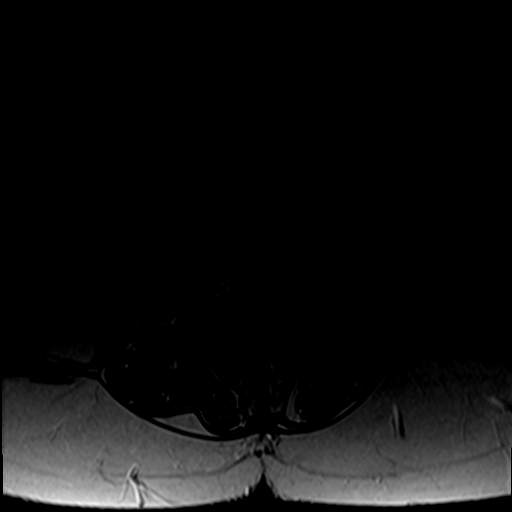
[im 23/44]
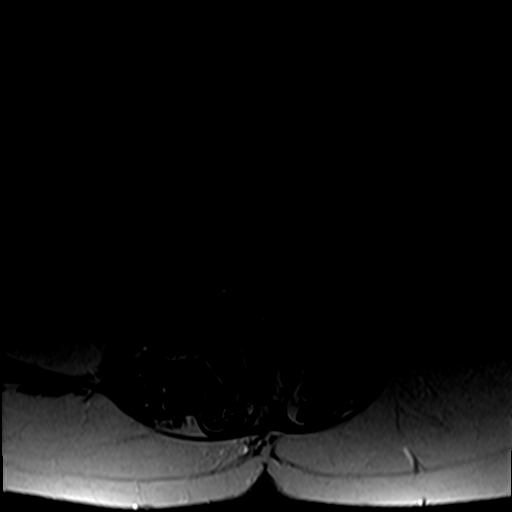
[im 26/44]
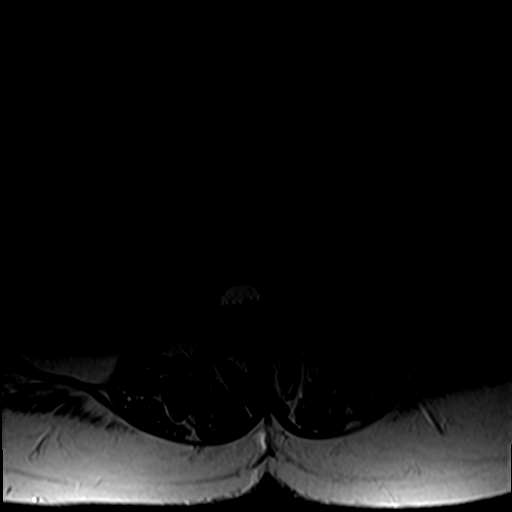
[im 32/44]
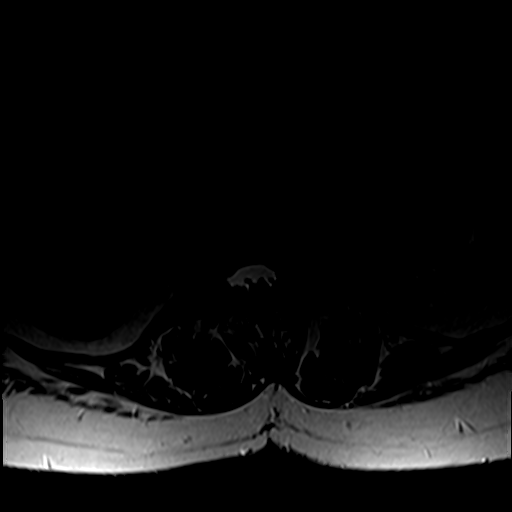
[im 38/44]
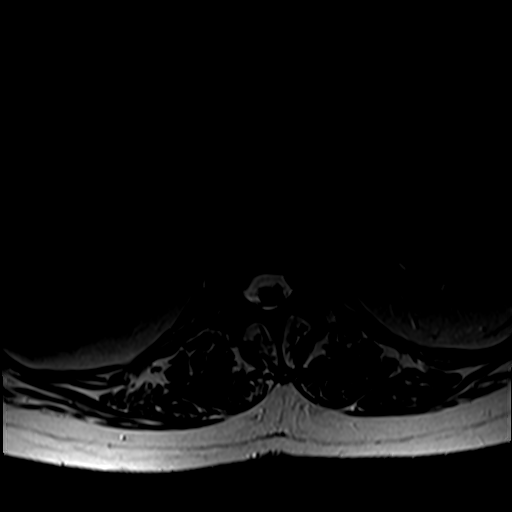
[im 44/44]
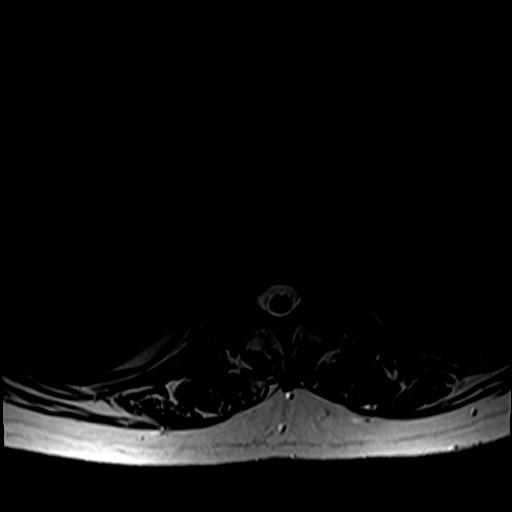

[Series 6: T1 · axial · 4.0mm · 0.39mm/px · z∈[-72,+121]mm · 4 of 44 slices shown (2 of 2)]
[im 3/44]
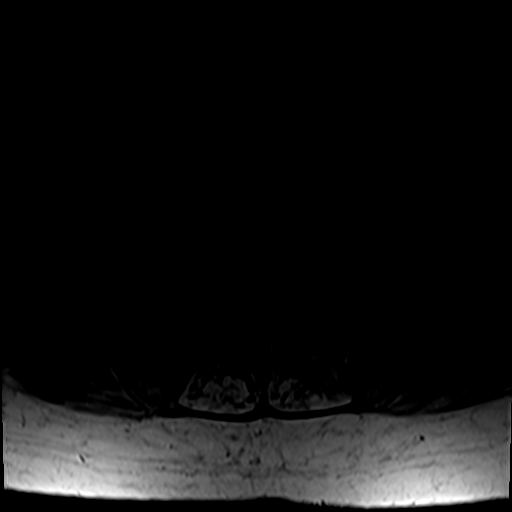
[im 6/44]
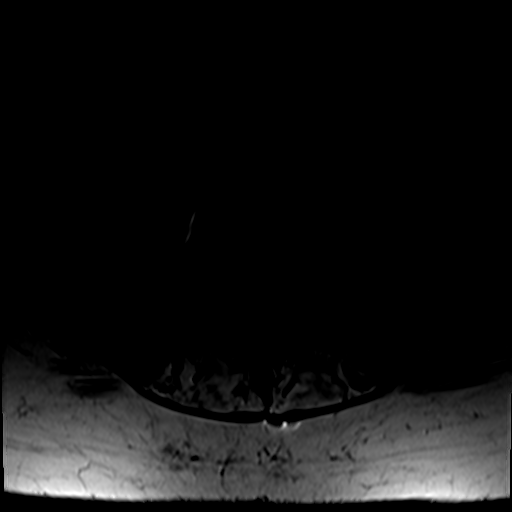
[im 23/44]
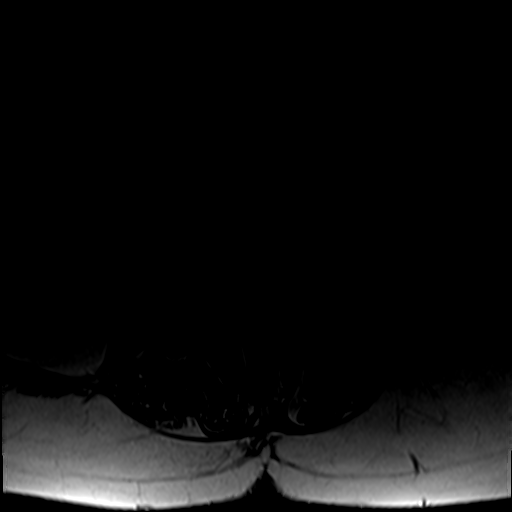
[im 38/44]
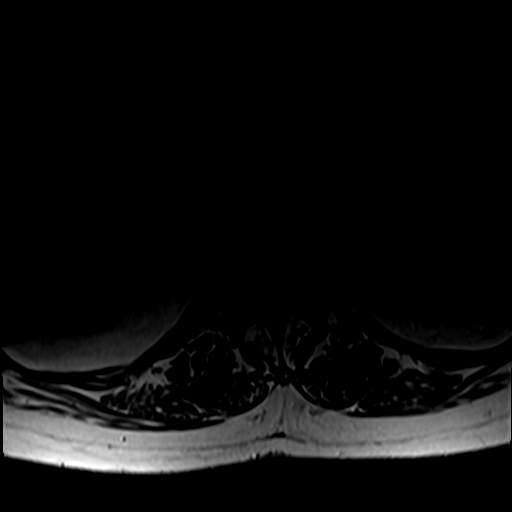

[25 of 48 positions shown; findings below may reference images not displayed]

FINDINGS: Segmentation:  Normal on the comparisons.

Alignment: Mild to moderate dextroconvex lumbar scoliosis and mild
anterolisthesis of L2 on L3 appear not significantly changed from
the radiographs last year.

Vertebrae: Mild hardware susceptibility artifact at L4-L5.
Accidentally, sagittal STIR imaging was performed without fat
saturation. There are chronic degenerative endplate marrow signal
changes at L2 and L3. No convincing acute osseous abnormality
identified. Visible sacrum and SI joints appear intact.

Conus medullaris and cauda equina: Conus extends to the L1 level. No
lower spinal cord or conus signal abnormality. Fatty filum
terminalis (series 6, image 31), normal variant.

Paraspinal and other soft tissues: Postoperative changes to the
lower lumbar paraspinal soft tissues with no adverse features.
Negative visible abdominal viscera. Diverticulosis of large bowel in
the pelvis.

Disc levels:

Partially visible lower thoracic spine degeneration with no lower
thoracic spinal stenosis through T12-L1.

L1-L2:  Negative.

L2-L3: Grade 1 anterolisthesis with severe disc space loss. Bulky
left eccentric circumferential disc osteophyte complex. Moderate to
severe involvement of the bilateral L2 foramen with moderate
superimposed facet hypertrophy. Mild to moderate spinal stenosis.
Moderate left lateral recess stenosis (left L3 nerve level). Severe
left and moderate to severe right L2 foraminal stenosis.

L3-L4: Severe disc space loss. Circumferential disc osteophyte
complex. Previous decompression in the midline here. Mild to
moderate residual facet hypertrophy. No spinal or convincing lateral
recess stenosis. Moderate to severe bilateral L3 foraminal stenosis.

L4-L5: Prior decompression and fusion. No stenosis. But questionable
interbody pseudoarthrosis.

L5-S1: Negative disc. Moderate right and mild left facet
hypertrophy. Trace facet joint fluid. No stenosis.
IMPRESSION: IMPRESSION

1. Prior decompression and fusion at L4-L5 with questionable
pseudoarthrosis, but no recurrent stenosis.

2. No significant adjacent segment disease at L5-S1.

Advanced disc and endplate degeneration at the L3-L4 adjacent
segment, but previous posterior decompression there. Moderate to
severe bilateral L3 neural foraminal stenosis.

And advanced degeneration at L2-L3 with grade 1 anterolisthesis.
Subsequent up to moderate spinal stenosis, moderate left lateral
recess stenosis, and severe > right neural foraminal stenosis.

Query L2 and/or L3 radiculitis.

## 2021-08-06 ENCOUNTER — Encounter: Payer: Self-pay | Admitting: Orthopaedic Surgery

## 2021-08-06 ENCOUNTER — Other Ambulatory Visit: Payer: Self-pay

## 2021-08-06 ENCOUNTER — Ambulatory Visit (INDEPENDENT_AMBULATORY_CARE_PROVIDER_SITE_OTHER): Payer: Medicare PPO | Admitting: Orthopaedic Surgery

## 2021-08-06 DIAGNOSIS — M5441 Lumbago with sciatica, right side: Secondary | ICD-10-CM | POA: Diagnosis not present

## 2021-08-06 DIAGNOSIS — M4807 Spinal stenosis, lumbosacral region: Secondary | ICD-10-CM | POA: Diagnosis not present

## 2021-08-06 DIAGNOSIS — G8929 Other chronic pain: Secondary | ICD-10-CM | POA: Diagnosis not present

## 2021-08-06 NOTE — Progress Notes (Signed)
The patient is a 85 year old female who comes in today to go over MRI of her lumbar spine.  She has remote history of a lower fusion done years ago in [place].  She ambulates with a cane.  She has had a left hip replacement and when she was first sent to me she felt that she was having right hip pain but her pain is in the sciatic region on the right side in the lower pelvis and the right side of her lumbar spine.  She denies any groin pain.  Her x-rays of her hip showed mild to moderate arthritis of the right hip.  On exam today I can still move her right hip fluidly and smoothly with full range of motion no pain in groin at all.  She again still describes pain with bending over and with weightbearing a long distance but in the back area.  The MRI of the lumbar spine shows her previous fusion.  At the L2-L3 level there is quite severe foraminal stenosis to the right side.  This could be affecting the right L3 nerve root and this seems to correlate with some of the pain she is experiencing.  She is actually had an intervention remotely in the past by Dr. Ernestina Patches.  She would like to try an epidural steroid again and I think it would be reasonable to have him try this to the right side of the L3 level.  She agrees with this treatment plan.  We will work on getting that appointment set up with Dr. Ernestina Patches.  All questions and concerns were answered and addressed.

## 2021-08-07 ENCOUNTER — Other Ambulatory Visit: Payer: Self-pay

## 2021-08-07 DIAGNOSIS — M4807 Spinal stenosis, lumbosacral region: Secondary | ICD-10-CM

## 2021-08-08 ENCOUNTER — Ambulatory Visit: Payer: Medicare PPO | Admitting: Oncology

## 2021-08-26 ENCOUNTER — Encounter: Payer: Self-pay | Admitting: Physical Medicine and Rehabilitation

## 2021-08-26 ENCOUNTER — Ambulatory Visit (INDEPENDENT_AMBULATORY_CARE_PROVIDER_SITE_OTHER): Payer: Medicare PPO | Admitting: Physical Medicine and Rehabilitation

## 2021-08-26 ENCOUNTER — Other Ambulatory Visit: Payer: Self-pay

## 2021-08-26 ENCOUNTER — Ambulatory Visit: Payer: Self-pay

## 2021-08-26 VITALS — BP 117/70 | HR 86

## 2021-08-26 DIAGNOSIS — M5416 Radiculopathy, lumbar region: Secondary | ICD-10-CM | POA: Diagnosis not present

## 2021-08-26 MED ORDER — DEXAMETHASONE SODIUM PHOSPHATE 10 MG/ML IJ SOLN
15.0000 mg | Freq: Once | INTRAMUSCULAR | Status: AC
  Administered 2021-08-26: 15 mg

## 2021-08-26 NOTE — Progress Notes (Signed)
Pt state lower back pain that travels to her right buttocks and down her right leg. Pt state walking and getting up out the bed. Pt state she takes over the counter pain meds to help ease her pain.  Numeric Pain Rating Scale and Functional Assessment Average Pain 3   In the last MONTH (on 0-10 scale) has pain interfered with the following?  1. General activity like being  able to carry out your everyday physical activities such as walking, climbing stairs, carrying groceries, or moving a chair?  Rating(9)   +Driver, -BT, -Dye Allergies.

## 2021-08-26 NOTE — Progress Notes (Signed)
Holly Simpson - 85 y.o. female MRN [id number]  Date of birth: 01/11/1936  Office Visit Note: Visit Date: 08/26/2021 PCP: Earnie Larsson, PA-C Referred by: Earnie Larsson, PA-C  Subjective: Chief Complaint  Patient presents with   Lower Back - Pain   Right Leg - Pain   HPI:  Holly Simpson is a 85 y.o. female who comes in today at the request of Dr. Jean Rosenthal for planned Right L3-4 Lumbar Transforaminal epidural steroid injection with fluoroscopic guidance.  The patient has failed conservative care including home exercise, medications, time and activity modification.  This injection will be diagnostic and hopefully therapeutic.  Please see requesting physician notes for further details and justification.  I actually saw the patient as a self-referral back in 2021 and completed left intra-articular hip injection for severe hip arthritis.  She has gone on now to have hip replacement by Dr. Ninfa Linden and doing quite well with that.  She now has right posterior lateral buttock and hip pain across the greater trochanter and into the anterior thigh to the knee.  Referral pattern is consistent with pain from the hip but also consistent with combination of greater trochanteric bursitis and may be an L3 radicular pain.  She does have new MRI of the lumbar spine since I have seen her.  This shows biforaminal narrowing at L3 and severe left more than right foraminal narrowing at L2-3.  There is moderate narrowing at L2-3.  This is above the level of fusion at L4-5.  We will complete right L3 transforaminal injection today.  Depending on relief would consider greater trochanteric bursa injection versus intra-articular hip injection.   ROS Otherwise per HPI.  Assessment & Plan: Visit Diagnoses:    ICD-10-CM   1. Lumbar radiculopathy  M54.16 XR C-ARM NO REPORT    Epidural Steroid injection    dexamethasone (DECADRON) injection 15 mg      Plan: No additional findings.   Meds &  Orders:  Meds ordered this encounter  Medications   dexamethasone (DECADRON) injection 15 mg    Orders Placed This Encounter  Procedures   XR C-ARM NO REPORT   Epidural Steroid injection    Follow-up: Return for visit to requesting provider as needed.   Procedures: No procedures performed  Lumbosacral Transforaminal Epidural Steroid Injection - Sub-Pedicular Approach with Fluoroscopic Guidance  Patient: Holly Simpson      Date of Birth: 06-27-1936 MRN: [id number] PCP: Earnie Larsson, PA-C      Visit Date: 08/26/2021   Universal Protocol:    Date/Time: 08/26/2021  Consent Given By: the patient  Position: PRONE  Additional Comments: Vital signs were monitored before and after the procedure. Patient was prepped and draped in the usual sterile fashion. The correct patient, procedure, and site was verified.   Injection Procedure Details:   Procedure diagnoses: Lumbar radiculopathy [M54.16]    Meds Administered:  Meds ordered this encounter  Medications   dexamethasone (DECADRON) injection 15 mg    Laterality: Right  Location/Site: L3  Needle:5.0 in., 22 ga.  Short bevel or Quincke spinal needle  Needle Placement: Transforaminal  Findings:    -Comments: Excellent flow of contrast along the nerve, nerve root and into the epidural space.  Procedure Details: After squaring off the end-plates to get a true AP view, the C-arm was positioned so that an oblique view of the foramen as noted above was visualized. The target area is just inferior to the "nose of the scotty  dog" or sub pedicular. The soft tissues overlying this structure were infiltrated with 2-3 ml. of 1% Lidocaine without Epinephrine.  The spinal needle was inserted toward the target using a "trajectory" view along the fluoroscope beam.  Under AP and lateral visualization, the needle was advanced so it did not puncture dura and was located close the 6 O'Clock position of the pedical in AP  tracterory. Biplanar projections were used to confirm position. Aspiration was confirmed to be negative for CSF and/or blood. A 1-2 ml. volume of Isovue-250 was injected and flow of contrast was noted at each level. Radiographs were obtained for documentation purposes.   After attaining the desired flow of contrast documented above, a 0.5 to 1.0 ml test dose of 0.25% Marcaine was injected into each respective transforaminal space.  The patient was observed for 90 seconds post injection.  After no sensory deficits were reported, and normal lower extremity motor function was noted,   the above injectate was administered so that equal amounts of the injectate were placed at each foramen (level) into the transforaminal epidural space.   Additional Comments:  The patient tolerated the procedure well Dressing: 2 x 2 sterile gauze and Band-Aid    Post-procedure details: Patient was observed during the procedure. Post-procedure instructions were reviewed.  Patient left the clinic in stable condition.    Clinical History: MRI LUMBAR SPINE WITHOUT CONTRAST   TECHNIQUE: Multiplanar, multisequence MR imaging of the lumbar spine was performed. No intravenous contrast was administered.   COMPARISON:  Lumbar radiographs 11/30/2019. CT Abdomen and Pelvis 02/22/2013.   FINDINGS: Segmentation:  Normal on the comparisons.   Alignment: Mild to moderate dextroconvex lumbar scoliosis and mild anterolisthesis of L2 on L3 appear not significantly changed from the radiographs last year.   Vertebrae: Mild hardware susceptibility artifact at L4-L5. Accidentally, sagittal STIR imaging was performed without fat saturation. There are chronic degenerative endplate marrow signal changes at L2 and L3. No convincing acute osseous abnormality identified. Visible sacrum and SI joints appear intact.   Conus medullaris and cauda equina: Conus extends to the L1 level. No lower spinal cord or conus signal  abnormality. Fatty filum terminalis (series 6, image 31), normal variant.   Paraspinal and other soft tissues: Postoperative changes to the lower lumbar paraspinal soft tissues with no adverse features. Negative visible abdominal viscera. Diverticulosis of large bowel in the pelvis.   Disc levels:   Partially visible lower thoracic spine degeneration with no lower thoracic spinal stenosis through T12-L1.   L1-L2:  Negative.   L2-L3: Grade 1 anterolisthesis with severe disc space loss. Bulky left eccentric circumferential disc osteophyte complex. Moderate to severe involvement of the bilateral L2 foramen with moderate superimposed facet hypertrophy. Mild to moderate spinal stenosis. Moderate left lateral recess stenosis (left L3 nerve level). Severe left and moderate to severe right L2 foraminal stenosis.   L3-L4: Severe disc space loss. Circumferential disc osteophyte complex. Previous decompression in the midline here. Mild to moderate residual facet hypertrophy. No spinal or convincing lateral recess stenosis. Moderate to severe bilateral L3 foraminal stenosis.   L4-L5: Prior decompression and fusion. No stenosis. But questionable interbody pseudoarthrosis.   L5-S1: Negative disc. Moderate right and mild left facet hypertrophy. Trace facet joint fluid. No stenosis.   IMPRESSION: IMPRESSION   1. Prior decompression and fusion at L4-L5 with questionable pseudoarthrosis, but no recurrent stenosis.   2. No significant adjacent segment disease at L5-S1.   Advanced disc and endplate degeneration at the L3-L4 adjacent segment, but previous  posterior decompression there. Moderate to severe bilateral L3 neural foraminal stenosis.   And advanced degeneration at L2-L3 with grade 1 anterolisthesis. Subsequent up to moderate spinal stenosis, moderate left lateral recess stenosis, and severe > right neural foraminal stenosis.   Query L2 and/or L3 radiculitis.      Electronically Signed   By: Genevie Ann M.D.   On: 08/06/2021 09:24  EDX Right upper  Test Date:  08/19/2017    NCV & EMG Findings:  Evaluation of the right median motor nerve showed reduced  amplitude (0.4 mV) and decreased conduction velocity  (Elbow-Wrist, 44 m/s).  The right median sensory nerve showed no  response (Palm) and no response (2nd Digit).  All remaining  nerves (as indicated in the following tables) were within normal  limits.     Impression: This is an abnormal study. There is  electrophysiological evidence of a right median mononeuropathy at  the wrist compatible with severe right carpal tunnel syndrome.   Recommendations: Patient will be referred to Orthopedic Hand  surgery for right carpal tunnel release. She was instructed to  wear a right carpal tunnel splint as much as possible until then.    _____________________________  Ardean Larsen, M.D.  Board Certified in Neurology  Board Certified in Clinical Neurophysiology     Objective:  VS:  HT:    WT:   BMI:     BP:117/70  HR:86bpm  TEMP: ( )  RESP:  Physical Exam Vitals and nursing note reviewed.  Constitutional:      General: She is not in acute distress.    Appearance: Normal appearance. She is obese. She is not ill-appearing.  HENT:     Head: Normocephalic and atraumatic.     Right Ear: External ear normal.     Left Ear: External ear normal.  Eyes:     Extraocular Movements: Extraocular movements intact.  Cardiovascular:     Rate and Rhythm: Normal rate.     Pulses: Normal pulses.  Pulmonary:     Effort: Pulmonary effort is normal. No respiratory distress.  Abdominal:     General: There is no distension.     Palpations: Abdomen is soft.  Musculoskeletal:        General: Tenderness present.     Cervical back: Neck supple.     Right lower leg: No edema.     Left lower leg: No edema.     Comments: Patient has good distal strength with no pain over the greater trochanters.  No clonus or  focal weakness.  Skin:    Findings: No erythema, lesion or rash.  Neurological:     General: No focal deficit present.     Mental Status: She is alert and oriented to person, place, and time.     Sensory: No sensory deficit.     Motor: No weakness or abnormal muscle tone.     Coordination: Coordination normal.  Psychiatric:        Mood and Affect: Mood normal.        Behavior: Behavior normal.     Imaging: No results found.

## 2021-08-26 NOTE — Procedures (Signed)
Lumbosacral Transforaminal Epidural Steroid Injection - Sub-Pedicular Approach with Fluoroscopic Guidance  Patient: Holly Simpson      Date of Birth: 1936-06-20 MRN: [id number] PCP: Earnie Larsson, PA-C      Visit Date: 08/26/2021   Universal Protocol:    Date/Time: 08/26/2021  Consent Given By: the patient  Position: PRONE  Additional Comments: Vital signs were monitored before and after the procedure. Patient was prepped and draped in the usual sterile fashion. The correct patient, procedure, and site was verified.   Injection Procedure Details:   Procedure diagnoses: Lumbar radiculopathy [M54.16]    Meds Administered:  Meds ordered this encounter  Medications   dexamethasone (DECADRON) injection 15 mg    Laterality: Right  Location/Site: L3  Needle:5.0 in., 22 ga.  Short bevel or Quincke spinal needle  Needle Placement: Transforaminal  Findings:    -Comments: Excellent flow of contrast along the nerve, nerve root and into the epidural space.  Procedure Details: After squaring off the end-plates to get a true AP view, the C-arm was positioned so that an oblique view of the foramen as noted above was visualized. The target area is just inferior to the "nose of the scotty dog" or sub pedicular. The soft tissues overlying this structure were infiltrated with 2-3 ml. of 1% Lidocaine without Epinephrine.  The spinal needle was inserted toward the target using a "trajectory" view along the fluoroscope beam.  Under AP and lateral visualization, the needle was advanced so it did not puncture dura and was located close the 6 O'Clock position of the pedical in AP tracterory. Biplanar projections were used to confirm position. Aspiration was confirmed to be negative for CSF and/or blood. A 1-2 ml. volume of Isovue-250 was injected and flow of contrast was noted at each level. Radiographs were obtained for documentation purposes.   After attaining the desired flow of  contrast documented above, a 0.5 to 1.0 ml test dose of 0.25% Marcaine was injected into each respective transforaminal space.  The patient was observed for 90 seconds post injection.  After no sensory deficits were reported, and normal lower extremity motor function was noted,   the above injectate was administered so that equal amounts of the injectate were placed at each foramen (level) into the transforaminal epidural space.   Additional Comments:  The patient tolerated the procedure well Dressing: 2 x 2 sterile gauze and Band-Aid    Post-procedure details: Patient was observed during the procedure. Post-procedure instructions were reviewed.  Patient left the clinic in stable condition.

## 2021-08-26 NOTE — Patient Instructions (Signed)

## 2021-09-11 ENCOUNTER — Encounter: Payer: Self-pay | Admitting: Oncology

## 2021-09-23 NOTE — Progress Notes (Incomplete)
Holly Simpson  [phone] [phone]    ID: Holly Simpson DOB: 1936/06/21  MR#: [id number]  JJO#:[id number]  Patient Care Team: [name] as PCP - General (Physician Assistant) Mauro Kaufmann, RN as Oncology Nurse Navigator Rockwell Germany, RN as Oncology Nurse Navigator Rolm Bookbinder, MD as Consulting Physician (General Surgery) Magrinat, Virgie Dad, MD as Consulting Physician (Oncology) Eppie Gibson, MD as Attending Physician (Radiation Oncology) Magnus Sinning, MD as Consulting Physician (Physical Medicine and Rehabilitation) Ria Clock, MD as Attending Physician (Radiology) Aurea Graff OTHER MD:  I connected with Holly Simpson on 09/23/21 at 12:00 PM EST by {Blank single:19197::"video enabled telemedicine visit","telephone visit"} and verified that I am speaking with the correct person using two identifiers.   I discussed the limitations, risks, security and privacy concerns of performing an evaluation and management service by telemedicine and the availability of in-person appointments. I also discussed with the patient that there may be a patient responsible charge related to this service. The patient expressed understanding and agreed to proceed.   Other persons participating in the visit and their role in the encounter: ***   Patients location: ***  Providers location: [facility]   I provided *** minutes of {Blank single:19197::"face-to-face video visit time","non face-to-face telephone visit time"} during this encounter, and > 50% was spent counseling as documented under my assessment & plan.   CHIEF COMPLAINT: estrogen receptor positive noninvasive breast cancer  CURRENT TREATMENT: Anastrozole; intensified screening   INTERVAL HISTORY: Holly Simpson was contacted today for follow up of her noninvasive breast cancer.  Holly Simpson opted to bypass surgery and radiation and start on  anastrozole. She tolerates this well with no issues such as arthralgias, hot flashes, or vaginal dryness.    Since her last visit, she underwent breast MRI on 03/28/2021 showing: breast composition C; stable 0.7 cm enhancing mass in lower-inner right breast; no evidence of malignancy in left breast or lymph nodes.  Her most recent bone density evaluation on 03/06/2021 showed osteopenia with a t score of -2.2.    REVIEW OF SYSTEMS: Phyllis    COVID 19 VACCINATION STATUS: Pfizer x3   HISTORY OF CURRENT ILLNESS: From the original intake note:  "Holly Simpson" herself palpated a left breast lump, as well as noted associated tenderness. She underwent bilateral diagnostic mammography with tomography and right breast ultrasonography at [facility] on 01/26/2020 showing: breast density category C; indeterminate 2 cm calcifications in linear distribution within right lower medial breast; possible 6 mm mass along anterior aspect of right lower medial calcifications; no abnormalities were seen sonographically in the right breast or axilla.  Accordingly on 02/13/2020 she proceeded to biopsy of the right breast calcifications in question. The pathology from this procedure ([id number]) showed: ductal carcinoma in situ, intermediate grade. Prognostic indicators significant for: estrogen receptor, 100% positive and progesterone receptor, 90% positive, both with strong staining intensity.  The patient's subsequent history is as detailed below.   PAST MEDICAL HISTORY: Past Medical History:  Diagnosis Date   Arthritis    Asthma    Cancer ([place])    skin ankle,breast   Hypertension    Pre-diabetes     PAST SURGICAL HISTORY: Past Surgical History:  Procedure Laterality Date   APPENDECTOMY     BACK SURGERY     L3-4   CHOLECYSTECTOMY     TONSILLECTOMY     TOTAL HIP ARTHROPLASTY Left 09/20/2020   Procedure: TOTAL HIP ARTHROPLASTY ANTERIOR APPROACH;  Surgeon: Rod Can,  MD;  Location: [facility];  Service:  Orthopedics;  Laterality: Left;   triguer finguer      She has a history of skin cancer removal from her right ankle and left ear.   FAMILY HISTORY: No family history on file.  Her father died at age 14 and her mother at age 26, both from heart issues. She has one brother and one sister. There is no family history of cancer to her knowledge.   GYNECOLOGIC HISTORY:  No LMP recorded. Patient is postmenopausal. Menarche: 43-55 years old [place] P 0 LMP around age 5 Contraceptive: never used HRT: never used  Hysterectomy? no BSO? 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  She is single lives by herself, with no pets.    ADVANCED DIRECTIVES: The patient has named Jana Half June Durene Romans as her healthcare power of attorney.  She can be reached at [phone].   HEALTH MAINTENANCE: Social History   Tobacco Use   Smoking status: Former   Smokeless tobacco: Never   Tobacco comments:    Quit 20 years ago  Vaping Use   Vaping Use: Never used  Substance Use Topics   Alcohol use: Yes    Comment: occas.   Drug use: Never     Colonoscopy: date unsure, repeat not indicated  PAP: "years" ago  Bone density: date unsure   No Known Allergies  Current Outpatient Medications  Medication Sig Dispense Refill   amLODipine (NORVASC) 5 MG tablet Take 5 mg by mouth daily.      anastrozole (ARIMIDEX) 1 MG tablet TAKE 1 TABLET(1 MG) BY MOUTH DAILY 90 tablet 4   famotidine (PEPCID) 20 MG tablet Take 10 mg by mouth daily.      furosemide (LASIX) 20 MG tablet Take 10 mg by mouth daily.     glipiZIDE (GLUCOTROL XL) 2.5 MG 24 hr tablet Take 2.5 mg by mouth daily with breakfast.      levothyroxine (SYNTHROID) 125 MCG tablet Take 125 mcg by mouth daily before breakfast.      lisinopril (ZESTRIL) 10 MG tablet Take 10 mg by mouth daily.      Multiple Vitamins-Minerals (MULTIVITAMIN WITH MINERALS) tablet Take 1 tablet by mouth  daily. Woman 50 +     Omega-3 Fatty Acids (FISH OIL) 1000 MG CAPS Take 1,000 mg by mouth daily.      traZODone (DESYREL) 50 MG tablet Take 50 mg by mouth at bedtime as needed.     Vitamin D, Ergocalciferol, (DRISDOL) 1.25 MG (50000 UNIT) CAPS capsule Take 50,000 Units by mouth every 7 (seven) days.      No current facility-administered medications for this visit.    OBJECTIVE:  There were no vitals filed for this visit.    There is no height or weight on file to calculate BMI.   Wt Readings from Last 3 Encounters:  07/17/21 178 lb (80.7 kg)  04/01/21 178 lb 1.9 oz (80.8 kg)  03/25/21 176 lb 1.6 oz (79.9 kg)     ECOG FS:1 - Symptomatic but completely ambulatory  Telephone visit 09/24/2021  {GENERAL: Patient is a well appearing female in no acute distress HEENT:  Sclerae anicteric.  Oropharynx clear and moist. No ulcerations or evidence of oropharyngeal candidiasis. Neck is supple.  NODES:  No cervical, supraclavicular, or axillary lymphadenopathy palpated.  BREAST EXAM:  no masses noted in either breast, benign breast exam.  LUNGS:  Clear to auscultation  bilaterally.  No wheezes or rhonchi. HEART:  Regular rate and rhythm. No murmur appreciated. ABDOMEN:  Soft, nontender.  Positive, normoactive bowel sounds. No organomegaly palpated. MSK:  No focal spinal tenderness to palpation. Full range of motion bilaterally in the upper extremities. EXTREMITIES:  No peripheral edema.   SKIN:  Clear with no obvious rashes or skin changes. No nail dyscrasia. NEURO:  Nonfocal. Well oriented.  Appropriate affect.}   LAB RESULTS:  CMP     Component Value Date/Time   NA 134 (L) 04/01/2021 1345   K 4.2 04/01/2021 1345   CL 96 (L) 04/01/2021 1345   CO2 24 04/01/2021 1345   GLUCOSE 157 (H) 04/01/2021 1345   BUN 23 04/01/2021 1345   CREATININE 1.80 (H) 04/01/2021 1345   CALCIUM 9.2 04/01/2021 1345   PROT 7.5 04/01/2021 1345   ALBUMIN 4.0 04/01/2021 1345   AST 19 04/01/2021 1345   ALT 13  04/01/2021 1345   ALKPHOS 80 04/01/2021 1345   BILITOT 0.6 04/01/2021 1345   GFRNONAA 28 (L) 04/01/2021 1345   GFRAA 38 (L) 02/22/2020 1221    No results found for: TOTALPROTELP, ALBUMINELP, A1GS, A2GS, BETS, BETA2SER, GAMS, MSPIKE, SPEI  Lab Results  Component Value Date   WBC 8.3 04/01/2021   NEUTROABS 5.1 04/01/2021   HGB 12.8 04/01/2021   HCT 39.1 04/01/2021   MCV 82.1 04/01/2021   PLT 286 04/01/2021    No results found for: LABCA2  No components found for: EQASTM196  No results for input(s): INR in the last 168 hours.  No results found for: LABCA2  No results found for: QIW979  No results found for: GXQ119  No results found for: ERD408  No results found for: CA2729  No components found for: HGQUANT  No results found for: CEA1 / No results found for: CEA1   No results found for: AFPTUMOR  No results found for: CHROMOGRNA  No results found for: KPAFRELGTCHN, LAMBDASER, KAPLAMBRATIO (kappa/lambda light chains)  No results found for: HGBA, HGBA2QUANT, HGBFQUANT, HGBSQUAN (Hemoglobinopathy evaluation)   No results found for: LDH  No results found for: IRON, TIBC, IRONPCTSAT (Iron and TIBC)  No results found for: FERRITIN  Urinalysis    Component Value Date/Time   COLORURINE YELLOW 09/14/2020 [place] 09/14/2020 1205   LABSPEC 1.020 09/14/2020 1205   PHURINE 5.0 09/14/2020 1205   GLUCOSEU NEGATIVE 09/14/2020 1205   HGBUR NEGATIVE 09/14/2020 1205   [place] 09/14/2020 1205   KETONESUR 5 (A) 09/14/2020 1205   PROTEINUR NEGATIVE 09/14/2020 1205   NITRITE NEGATIVE 09/14/2020 1205   LEUKOCYTESUR MODERATE (A) 09/14/2020 1205     STUDIES: Epidural Steroid injection  Result Date: 08/26/2021 Magnus Sinning, MD     08/26/2021  1:50 PM Lumbosacral Transforaminal Epidural Steroid Injection - Sub-Pedicular Approach with Fluoroscopic Guidance Patient: Holly Simpson     Date of Birth: Feb 20, 1936 MRN: [id number] PCP:  Earnie Larsson, PA-C     Visit Date: 08/26/2021  Universal Protocol:   Date/Time: 08/26/2021 Consent Given By: the patient Position: PRONE Additional Comments: Vital signs were monitored before and after the procedure. Patient was prepped and draped in the usual sterile fashion. The correct patient, procedure, and site was verified. Injection Procedure Details: Procedure diagnoses: Lumbar radiculopathy [M54.16]  Meds Administered: Meds ordered this encounter Medications  dexamethasone (DECADRON) injection 15 mg Laterality: Right Location/Site: L3 Needle:5.0 in., 22 ga.  Short bevel or Quincke spinal needle Needle Placement: Transforaminal Findings:   -Comments: Excellent flow of  contrast along the nerve, nerve root and into the epidural space. Procedure Details: After squaring off the end-plates to get a true AP view, the C-arm was positioned so that an oblique view of the foramen as noted above was visualized. The target area is just inferior to the "nose of the scotty dog" or sub pedicular. The soft tissues overlying this structure were infiltrated with 2-3 ml. of 1% Lidocaine without Epinephrine. The spinal needle was inserted toward the target using a "trajectory" view along the fluoroscope beam.  Under AP and lateral visualization, the needle was advanced so it did not puncture dura and was located close the 6 O'Clock position of the pedical in AP tracterory. Biplanar projections were used to confirm position. Aspiration was confirmed to be negative for CSF and/or blood. A 1-2 ml. volume of Isovue-250 was injected and flow of contrast was noted at each level. Radiographs were obtained for documentation purposes. After attaining the desired flow of contrast documented above, a 0.5 to 1.0 ml test dose of 0.25% Marcaine was injected into each respective transforaminal space.  The patient was observed for 90 seconds post injection.  After no sensory deficits were reported, and normal lower extremity motor  function was noted,   the above injectate was administered so that equal amounts of the injectate were placed at each foramen (level) into the transforaminal epidural space. Additional Comments: The patient tolerated the procedure well Dressing: 2 x 2 sterile gauze and Band-Aid  Post-procedure details: Patient was observed during the procedure. Post-procedure instructions were reviewed. Patient left the clinic in stable condition.   XR C-ARM NO REPORT  Result Date: 08/26/2021 Please see Notes tab for imaging impression.     ELIGIBLE FOR AVAILABLE RESEARCH PROTOCOL: Decided against COMET trial  ASSESSMENT: 85 y.o. [place], [place] woman status post left breast biopsy 02/13/2020 for ductal carcinoma in situ, grade 2, strongly estrogen and progesterone receptor positive  (a) the patient originally palpated a mass, subsequent ultrasound did not document a mass  (1) patient opted for intensified screening with no surgery or radiation (and opted against COMET)  (a) mammography every June  (b) breast MRI every December  (2) anastrozole started 02/22/2020  (a) bone density scan at [facility] 03/2021 that showes osteopenia with a t score of -2.2  PLAN: Holly Simpson is here today for follow up of her left breast DCIS currently undergoing treatment with Anastrozole and intensified screening instead of surgery or radiation.  She continues on Anastrozole with good tolerance and she was recommended to take this daily.  Her most recent mammogram shows stable calcification size.  She will return in 6 months for breast MRI which I placed orders for today.  Holly Simpson agrees with the plan.  Bone density testing shows osteopenia, closer to osteoporosis.  She and I reviewed calcium, vitamin d and weight bearing exercises.  WE also discussed prolia to improve her bone density.  She and I discussed this in detail, and reviewed risks versus benefits.  She and I reviewed taking extra calcium on the day of injection, and discussed the  risk including but not limited to osteonecrosis of the jaw.  She sees her dentist regularly and has no current dental concerns.  She knows to let them be aware she is receiving this when she sees them again.    Holly Simpson will return in about a week for labs and injection, and again in November for f/u with Dr. Jana Hakim.  She knows to call for any questions that may arise  between now and her next appointment.  We are happy to see her sooner if needed.  Total encounter time: 30 minutes* in chart review, lab review, face to face visit time, order entry, and documentation of the encounter.    Wilber Bihari, NP 09/23/21 3:51 PM Medical Oncology and Hematology [facility] [place], [place] [address] Tel. [phone]    Fax. [phone]   *Total Encounter Time as defined by the Centers for Medicare and Medicaid Services includes, in addition to the face-to-face time of a patient visit (documented in the note above) non-face-to-face time: obtaining and reviewing outside history, ordering and reviewing medications, tests or procedures, care coordination (communications with other health care professionals or caregivers) and documentation in the medical record.

## 2021-09-24 ENCOUNTER — Inpatient Hospital Stay: Payer: Medicare PPO | Admitting: Oncology

## 2021-09-24 ENCOUNTER — Inpatient Hospital Stay: Payer: Medicare PPO | Attending: Oncology | Admitting: Oncology

## 2021-09-24 ENCOUNTER — Other Ambulatory Visit: Payer: Self-pay

## 2021-09-24 VITALS — BP 141/65 | HR 80 | Temp 97.5°F | Resp 16 | Ht 60.0 in | Wt 175.8 lb

## 2021-09-24 DIAGNOSIS — Z79899 Other long term (current) drug therapy: Secondary | ICD-10-CM | POA: Diagnosis not present

## 2021-09-24 DIAGNOSIS — D0511 Intraductal carcinoma in situ of right breast: Secondary | ICD-10-CM | POA: Insufficient documentation

## 2021-09-24 DIAGNOSIS — M8580 Other specified disorders of bone density and structure, unspecified site: Secondary | ICD-10-CM | POA: Diagnosis not present

## 2021-09-24 NOTE — Progress Notes (Signed)
[place]  [phone] [phone]) [phone]     ID: Holly Simpson DOB: Jul 06, 1936  MR#: [id number]  TKW#:[id number]  Patient Care Team: [name] as PCP - General (Physician Assistant) Mauro Kaufmann, RN as Oncology Nurse Navigator Rockwell Germany, RN as Oncology Nurse Navigator Rolm Bookbinder, MD as Consulting Physician (General Surgery) Joyclyn Plazola, Virgie Dad, MD as Consulting Physician (Oncology) Eppie Gibson, MD as Attending Physician (Radiation Oncology) Magnus Sinning, MD as Consulting Physician (Physical Medicine and Rehabilitation) Ria Clock, MD as Attending Physician (Radiology) Chauncey Cruel, MD OTHER MD:  CHIEF COMPLAINT: estrogen receptor positive noninvasive breast cancer  CURRENT TREATMENT: Anastrozole; intensified screening; denosumab/Prolia   INTERVAL HISTORY: Holly Simpson returns today for follow up of her noninvasive breast cancer accompanied by her Sister [name].Holly Simpson opted to bypass surgery and radiation and start on anastrozole. She is tolerating anastrozole with no hot flashes, vaginal dryness, or arthralgias/myalgias.  Since her last visit, she underwent breast MRI 03/28/2021.  This showed breast density category C.  There was a stable 0.7 cm enhancing mass in the lower inner right breast, at the site of biopsy-proven DCIS.  There were no other findings of concern.  Mammography at [facility] 09/11/2021 showed breast density C.  There was no evidence of malignancy.  She is scheduled for her next denosumab/Prolia dose 10/01/2021  REVIEW OF SYSTEMS: Holly Simpson continues to work with Dr. Ninfa Linden regarding her hip and other orthopedic issues.  She does not exercise regularly but does her own housework, mows the lawn on a low riding mower, and walks in stores.  She was not able to tell me how her sugars are doing.  A detailed review of systems today was otherwise stable.  HISTORY OF CURRENT ILLNESS: From the  original intake note:  "Holly Simpson" herself palpated a left breast lump, as well as noted associated tenderness. She underwent bilateral diagnostic mammography with tomography and right breast ultrasonography at [facility] on 01/26/2020 showing: breast density category C; indeterminate 2 cm calcifications in linear distribution within right lower medial breast; possible 6 mm mass along anterior aspect of right lower medial calcifications; no abnormalities were seen sonographically in the right breast or axilla.  Accordingly on 02/13/2020 she proceeded to biopsy of the right breast calcifications in question. The pathology from this procedure ([id number]) showed: ductal carcinoma in situ, intermediate grade. Prognostic indicators significant for: estrogen receptor, 100% positive and progesterone receptor, 90% positive, both with strong staining intensity.  The patient's subsequent history is as detailed below.   PAST MEDICAL HISTORY: Past Medical History:  Diagnosis Date   Arthritis    Asthma    Cancer ([place])    skin ankle,breast   Hypertension    Pre-diabetes     PAST SURGICAL HISTORY: Past Surgical History:  Procedure Laterality Date   APPENDECTOMY     BACK SURGERY     L3-4   CHOLECYSTECTOMY     TONSILLECTOMY     TOTAL HIP ARTHROPLASTY Left 09/20/2020   Procedure: TOTAL HIP ARTHROPLASTY ANTERIOR APPROACH;  Surgeon: Rod Can, MD;  Location: [facility];  Service: Orthopedics;  Laterality: Left;   triguer finguer      She has a history of skin cancer removal from her right ankle and left ear.   FAMILY HISTORY: No family history on file.  Her father died at age 88 and her mother at age 61, both from heart issues. She has one brother and one sister. There is no family history of  cancer to her knowledge.   GYNECOLOGIC HISTORY:  No LMP recorded. Patient is postmenopausal. Menarche: 66-34 years old [place] P 0 LMP around age 17 Contraceptive: never used HRT: never used  Hysterectomy?  no BSO? 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  She is single lives by herself, with no pets.    ADVANCED DIRECTIVES: The patient has named Jana Half June Durene Romans as her healthcare power of attorney.  She can be reached at [phone].   HEALTH MAINTENANCE: Social History   Tobacco Use   Smoking status: Former   Smokeless tobacco: Never   Tobacco comments:    Quit 20 years ago  Vaping Use   Vaping Use: Never used  Substance Use Topics   Alcohol use: Yes    Comment: occas.   Drug use: Never     Colonoscopy: date unsure, repeat not indicated  PAP: "years" ago  Bone density: date unsure   No Known Allergies  Current Outpatient Medications  Medication Sig Dispense Refill   amLODipine (NORVASC) 5 MG tablet Take 5 mg by mouth daily.      anastrozole (ARIMIDEX) 1 MG tablet TAKE 1 TABLET(1 MG) BY MOUTH DAILY 90 tablet 4   famotidine (PEPCID) 20 MG tablet Take 10 mg by mouth daily.      furosemide (LASIX) 20 MG tablet Take 10 mg by mouth daily.     glipiZIDE (GLUCOTROL XL) 2.5 MG 24 hr tablet Take 2.5 mg by mouth daily with breakfast.      levothyroxine (SYNTHROID) 125 MCG tablet Take 125 mcg by mouth daily before breakfast.      lisinopril (ZESTRIL) 10 MG tablet Take 10 mg by mouth daily.      Multiple Vitamins-Minerals (MULTIVITAMIN WITH MINERALS) tablet Take 1 tablet by mouth daily. Woman 50 +     Omega-3 Fatty Acids (FISH OIL) 1000 MG CAPS Take 1,000 mg by mouth daily.      traZODone (DESYREL) 50 MG tablet Take 50 mg by mouth at bedtime as needed.     Vitamin D, Ergocalciferol, (DRISDOL) 1.25 MG (50000 UNIT) CAPS capsule Take 50,000 Units by mouth every 7 (seven) days.      No current facility-administered medications for this visit.    OBJECTIVE: White woman who appears stated age  41:   09/24/21 1200  BP: (!) 141/65  Pulse: 80  Resp: 16  Temp: (!) 97.5 F (36.4 C)  SpO2: 97%     Body  mass index is 34.33 kg/m.   Wt Readings from Last 3 Encounters:  09/24/21 175 lb 12.8 oz (79.7 kg)  07/17/21 178 lb (80.7 kg)  04/01/21 178 lb 1.9 oz (80.8 kg)      ECOG FS:1 - Symptomatic but completely ambulatory  Sclerae unicteric, EOMs intact Wearing a mask No cervical or supraclavicular adenopathy Lungs no rales or rhonchi Heart regular rate and rhythm Abd soft, obese, nontender, positive bowel sounds MSK no focal spinal tenderness, no upper extremity lymphedema Neuro: nonfocal, well oriented, appropriate affect Breasts: I do not palpate a mass in either breast.  There are no skin or nipple changes of concern.  Both axillae are benign.   LAB RESULTS:  CMP     Component Value Date/Time   NA 134 (L) 04/01/2021 1345   K 4.2 04/01/2021 1345   CL 96 (L) 04/01/2021 1345   CO2 24 04/01/2021 1345   GLUCOSE 157 (H) 04/01/2021 1345  BUN 23 04/01/2021 1345   CREATININE 1.80 (H) 04/01/2021 1345   CALCIUM 9.2 04/01/2021 1345   PROT 7.5 04/01/2021 1345   ALBUMIN 4.0 04/01/2021 1345   AST 19 04/01/2021 1345   ALT 13 04/01/2021 1345   ALKPHOS 80 04/01/2021 1345   BILITOT 0.6 04/01/2021 1345   GFRNONAA 28 (L) 04/01/2021 1345   GFRAA 38 (L) 02/22/2020 1221    No results found for: TOTALPROTELP, ALBUMINELP, A1GS, A2GS, BETS, BETA2SER, GAMS, MSPIKE, SPEI  Lab Results  Component Value Date   WBC 8.3 04/01/2021   NEUTROABS 5.1 04/01/2021   HGB 12.8 04/01/2021   HCT 39.1 04/01/2021   MCV 82.1 04/01/2021   PLT 286 04/01/2021    No results found for: LABCA2  No components found for: [id number]  No results for input(s): INR in the last 168 hours.  No results found for: LABCA2  No results found for: YQM578  No results found for: ION629  No results found for: BMW413  No results found for: CA2729  No components found for: HGQUANT  No results found for: CEA1 / No results found for: CEA1   No results found for: AFPTUMOR  No results found for: CHROMOGRNA  No  results found for: KPAFRELGTCHN, LAMBDASER, KAPLAMBRATIO (kappa/lambda light chains)  No results found for: HGBA, HGBA2QUANT, HGBFQUANT, HGBSQUAN (Hemoglobinopathy evaluation)   No results found for: LDH  No results found for: IRON, TIBC, IRONPCTSAT (Iron and TIBC)  No results found for: FERRITIN  Urinalysis    Component Value Date/Time   COLORURINE YELLOW 09/14/2020 [place] 09/14/2020 1205   LABSPEC 1.020 09/14/2020 1205   PHURINE 5.0 09/14/2020 1205   GLUCOSEU NEGATIVE 09/14/2020 1205   HGBUR NEGATIVE 09/14/2020 1205   [place] 09/14/2020 1205   KETONESUR 5 (A) 09/14/2020 1205   PROTEINUR NEGATIVE 09/14/2020 1205   NITRITE NEGATIVE 09/14/2020 1205   LEUKOCYTESUR MODERATE (A) 09/14/2020 1205     STUDIES: Epidural Steroid injection  Result Date: 08/26/2021 Magnus Sinning, MD     08/26/2021  1:50 PM Lumbosacral Transforaminal Epidural Steroid Injection - Sub-Pedicular Approach with Fluoroscopic Guidance Patient: Holly Simpson     Date of Birth: 1936/08/08 MRN: [id number] PCP: Earnie Larsson, PA-C     Visit Date: 08/26/2021  Universal Protocol:   Date/Time: 08/26/2021 Consent Given By: the patient Position: PRONE Additional Comments: Vital signs were monitored before and after the procedure. Patient was prepped and draped in the usual sterile fashion. The correct patient, procedure, and site was verified. Injection Procedure Details: Procedure diagnoses: Lumbar radiculopathy [M54.16]  Meds Administered: Meds ordered this encounter Medications  dexamethasone (DECADRON) injection 15 mg Laterality: Right Location/Site: L3 Needle:5.0 in., 22 ga.  Short bevel or Quincke spinal needle Needle Placement: Transforaminal Findings:   -Comments: Excellent flow of contrast along the nerve, nerve root and into the epidural space. Procedure Details: After squaring off the end-plates to get a true AP view, the C-arm was positioned so that an oblique view of the  foramen as noted above was visualized. The target area is just inferior to the "nose of the scotty dog" or sub pedicular. The soft tissues overlying this structure were infiltrated with 2-3 ml. of 1% Lidocaine without Epinephrine. The spinal needle was inserted toward the target using a "trajectory" view along the fluoroscope beam.  Under AP and lateral visualization, the needle was advanced so it did not puncture dura and was located close the 6 O'Clock position of the pedical in AP tracterory. Biplanar  projections were used to confirm position. Aspiration was confirmed to be negative for CSF and/or blood. A 1-2 ml. volume of Isovue-250 was injected and flow of contrast was noted at each level. Radiographs were obtained for documentation purposes. After attaining the desired flow of contrast documented above, a 0.5 to 1.0 ml test dose of 0.25% Marcaine was injected into each respective transforaminal space.  The patient was observed for 90 seconds post injection.  After no sensory deficits were reported, and normal lower extremity motor function was noted,   the above injectate was administered so that equal amounts of the injectate were placed at each foramen (level) into the transforaminal epidural space. Additional Comments: The patient tolerated the procedure well Dressing: 2 x 2 sterile gauze and Band-Aid  Post-procedure details: Patient was observed during the procedure. Post-procedure instructions were reviewed. Patient left the clinic in stable condition.   XR C-ARM NO REPORT  Result Date: 08/26/2021 Please see Notes tab for imaging impression.     ELIGIBLE FOR AVAILABLE RESEARCH PROTOCOL: Decided against COMET trial  ASSESSMENT: 85 y.o. [place], [place] woman status post left breast biopsy 02/13/2020 for ductal carcinoma in situ, grade 2, strongly estrogen and progesterone receptor positive  (a) the patient originally palpated a mass, subsequent ultrasound did not document a mass  (1) patient  opted for intensified screening with no surgery or radiation (and opted against COMET)  (a) mammography every April  (b) breast MRI every October  (2) anastrozole started 02/22/2020  (a) bone density [facility] at [facility] 03/06/2021 showed a T score of -2.2  PLAN: Holly Simpson is now 1-1/2 years out from definitive diagnosis of noninvasive breast cancer.  She opted against surgery or radiation and is receiving anastrozole as her only treatment.  Accordingly we are following her with imaging every 6 months and she just had her mammogram which is fine.  Once her breast density decreases perhaps to a A density we could dispense with the breast MRI.  Her next imaging is scheduled for June and she will see [place] again in July.  She also receives denosumab/Xgeva for her significant osteopenia.  She has a dose later this month and then again in June.  She will be due for repeat bone density in June 2024  Total encounter time 25 minutes.Sarajane Jews C. Ever Halberg, MD 09/24/2021 12:16 PM Medical Oncology and Hematology [facility] [address], [place] [address] Tel. [phone]    Fax. [phone]   This document serves as a record of services personally performed by Lurline Del, MD. It was created on his behalf by Wilburn Mylar, a trained medical scribe. The creation of this record is based on the scribe's personal observations and the provider's statements to them.   I, Lurline Del MD, have reviewed the above documentation for accuracy and completeness, and I agree with the above.   *Total Encounter Time as defined by the Centers for Medicare and Medicaid Services includes, in addition to the face-to-face time of a patient visit (documented in the note above) non-face-to-face time: obtaining and reviewing outside history, ordering and reviewing medications, tests or procedures, care coordination (communications with other health care professionals or caregivers) and  documentation in the medical record.

## 2021-09-25 NOTE — Progress Notes (Incomplete)
[place]  [phone] [phone]    ID: [name] DOB: [birth year]  MR#: [id number]  SFK#:[id number]  Patient Care Team: [name] as PCP - General (Physician Assistant) Mauro Kaufmann, RN as Oncology Nurse Navigator Rockwell Germany, RN as Oncology Nurse Navigator Rolm Bookbinder, MD as Consulting Physician (General Surgery) Kriste Broman, Virgie Dad, MD as Consulting Physician (Oncology) Eppie Gibson, MD as Attending Physician (Radiation Oncology) Magnus Sinning, MD as Consulting Physician (Physical Medicine and Rehabilitation) Ria Clock, MD as Attending Physician (Radiology) Chauncey Cruel, MD OTHER MD:  CHIEF COMPLAINT: estrogen receptor positive noninvasive breast cancer  CURRENT TREATMENT: Anastrozole; intensified screening; denosumab/Prolia   INTERVAL HISTORY: [name] returns today for follow up of her noninvasive breast cancer accompanied by her Sister [name].[name] opted to bypass surgery and radiation and start on anastrozole. She is tolerating anastrozole with no hot flashes, vaginal dryness, or arthralgias/myalgias.  She is scheduled for her next denosumab/Prolia dose 10/01/2021  She had bilateral breast MRI 03/28/2021 showing a stable 0.7 cm mass adjacent to the prior biopsy clip in the right breast.  This was followed by right diagnostic mammography at [facility] 09/11/2021 showing breast density category C.  This showed calcifications in the right breast lower inner quadrant to be stable.  There was no interval change.  She is scheduled for bilateral diagnostic mammography at [facility] 03/19/2022.   REVIEW OF SYSTEMS: [name]    COVID 9 VACCINATION STATUS: [place] x2   HISTORY OF CURRENT ILLNESS: From the original intake note:  "[name]" herself palpated a left breast lump, as well as noted associated tenderness. She underwent bilateral diagnostic mammography with tomography and right breast  ultrasonography at [facility] on 01/26/2020 showing: breast density category C; indeterminate 2 cm calcifications in linear distribution within right lower medial breast; possible 6 mm mass along anterior aspect of right lower medial calcifications; no abnormalities were seen sonographically in the right breast or axilla.  Accordingly on 02/13/2020 she proceeded to biopsy of the right breast calcifications in question. The pathology from this procedure ([id number]) showed: ductal carcinoma in situ, intermediate grade. Prognostic indicators significant for: estrogen receptor, 100% positive and progesterone receptor, 90% positive, both with strong staining intensity.  The patient's subsequent history is as detailed below.   PAST MEDICAL HISTORY: Past Medical History:  Diagnosis Date   Arthritis    Asthma    Cancer ([place])    skin ankle,breast   Hypertension    Pre-diabetes     PAST SURGICAL HISTORY: Past Surgical History:  Procedure Laterality Date   APPENDECTOMY     BACK SURGERY     L3-4   CHOLECYSTECTOMY     TONSILLECTOMY     TOTAL HIP ARTHROPLASTY Left 09/20/2020   Procedure: TOTAL HIP ARTHROPLASTY ANTERIOR APPROACH;  Surgeon: Rod Can, MD;  Location: [facility];  Service: Orthopedics;  Laterality: Left;   triguer finguer      She has a history of skin cancer removal from her right ankle and left ear.   FAMILY HISTORY: No family history on file.  Her father died at age 83 and her mother at age 83, both from heart issues. She has one brother and one sister. There is no family history of cancer to her knowledge.   GYNECOLOGIC HISTORY:  No LMP recorded. Patient is postmenopausal. Menarche: [age] years old GX P 0 LMP around age 85 Contraceptive: never used HRT: never used  Hysterectomy? no BSO? 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  She is single lives by herself, with no pets.    ADVANCED  DIRECTIVES: The patient has named Jana Half June Durene Romans as her healthcare power of attorney.  She can be reached at [phone].   HEALTH MAINTENANCE: Social History   Tobacco Use   Smoking status: Former   Smokeless tobacco: Never   Tobacco comments:    Quit 20 years ago  Vaping Use   Vaping Use: Never used  Substance Use Topics   Alcohol use: Yes    Comment: occas.   Drug use: Never     Colonoscopy: date unsure, repeat not indicated  PAP: "years" ago  Bone density: date unsure   No Known Allergies  Current Outpatient Medications  Medication Sig Dispense Refill   amLODipine (NORVASC) 5 MG tablet Take 5 mg by mouth daily.      anastrozole (ARIMIDEX) 1 MG tablet TAKE 1 TABLET(1 MG) BY MOUTH DAILY 90 tablet 4   famotidine (PEPCID) 20 MG tablet Take 10 mg by mouth daily.      furosemide (LASIX) 20 MG tablet Take 10 mg by mouth daily.     glipiZIDE (GLUCOTROL XL) 2.5 MG 24 hr tablet Take 2.5 mg by mouth daily with breakfast.      levothyroxine (SYNTHROID) 125 MCG tablet Take 125 mcg by mouth daily before breakfast.      lisinopril (ZESTRIL) 10 MG tablet Take 10 mg by mouth daily.      Multiple Vitamins-Minerals (MULTIVITAMIN WITH MINERALS) tablet Take 1 tablet by mouth daily. Woman 50 +     Omega-3 Fatty Acids (FISH OIL) 1000 MG CAPS Take 1,000 mg by mouth daily.      traZODone (DESYREL) 50 MG tablet Take 50 mg by mouth at bedtime as needed.     Vitamin D, Ergocalciferol, (DRISDOL) 1.25 MG (50000 UNIT) CAPS capsule Take 50,000 Units by mouth every 7 (seven) days.      No current facility-administered medications for this visit.    OBJECTIVE: White woman who appears stated age  There were no vitals filed for this visit.    There is no height or weight on file to calculate BMI.   Wt Readings from Last 3 Encounters:  09/24/21 175 lb 12.8 oz (79.7 kg)  07/17/21 178 lb (80.7 kg)  04/01/21 178 lb 1.9 oz (80.8 kg)     ECOG FS:1 - Symptomatic but completely ambulatory  Sclerae  unicteric, EOMs intact Wearing a mask No cervical or supraclavicular adenopathy Lungs no rales or rhonchi Heart regular rate and rhythm Abd soft, nontender, positive bowel sounds MSK no focal spinal tenderness, no upper extremity lymphedema Neuro: nonfocal, well oriented, appropriate affect Breasts:    {Sclerae unicteric, EOMs intact Wearing a mask No cervical or supraclavicular adenopathy Lungs no rales or rhonchi Heart regular rate and rhythm Abd soft, obese, nontender, positive bowel sounds MSK no focal spinal tenderness, no upper extremity lymphedema Neuro: nonfocal, well oriented, appropriate affect Breasts: I do not palpate a mass in either breast.  There are no skin or nipple changes of concern.  Both axillae are benign.}   LAB RESULTS:  CMP     Component Value Date/Time   NA 134 (L) 04/01/2021 1345   K 4.2 04/01/2021 1345   CL 96 (L) 04/01/2021 1345   CO2 24 04/01/2021 1345   GLUCOSE 157 (H) 04/01/2021 1345   BUN 23 04/01/2021 1345   CREATININE 1.80 (  H) 04/01/2021 1345   CALCIUM 9.2 04/01/2021 1345   PROT 7.5 04/01/2021 1345   ALBUMIN 4.0 04/01/2021 1345   AST 19 04/01/2021 1345   ALT 13 04/01/2021 1345   ALKPHOS 80 04/01/2021 1345   BILITOT 0.6 04/01/2021 1345   GFRNONAA 28 (L) 04/01/2021 1345   GFRAA 38 (L) 02/22/2020 1221    No results found for: TOTALPROTELP, ALBUMINELP, A1GS, A2GS, BETS, BETA2SER, GAMS, MSPIKE, SPEI  Lab Results  Component Value Date   WBC 8.3 04/01/2021   NEUTROABS 5.1 04/01/2021   HGB 12.8 04/01/2021   HCT 39.1 04/01/2021   MCV 82.1 04/01/2021   PLT 286 04/01/2021    No results found for: LABCA2  No components found for: TKPTWS568  No results for input(s): INR in the last 168 hours.  No results found for: LABCA2  No results found for: LEX517  No results found for: GYF749  No results found for: SWH675  No results found for: CA2729  No components found for: HGQUANT  No results found for: CEA1 / No results found  for: CEA1   No results found for: AFPTUMOR  No results found for: CHROMOGRNA  No results found for: KPAFRELGTCHN, LAMBDASER, KAPLAMBRATIO (kappa/lambda light chains)  No results found for: HGBA, HGBA2QUANT, HGBFQUANT, HGBSQUAN (Hemoglobinopathy evaluation)   No results found for: LDH  No results found for: IRON, TIBC, IRONPCTSAT (Iron and TIBC)  No results found for: FERRITIN  Urinalysis    Component Value Date/Time   COLORURINE YELLOW 09/14/2020 [place] 09/14/2020 1205   LABSPEC 1.020 09/14/2020 1205   PHURINE 5.0 09/14/2020 [place] 09/14/2020 [place] 09/14/2020 1205   BILIRUBINUR NEGATIVE 09/14/2020 1205   KETONESUR 5 (A) 09/14/2020 [place] 09/14/2020 1205   NITRITE NEGATIVE 09/14/2020 1205   LEUKOCYTESUR MODERATE (A) 09/14/2020 1205    STUDIES: No results found.    ELIGIBLE FOR AVAILABLE RESEARCH PROTOCOL: Decided against COMET trial  ASSESSMENT: 85 y.o. [place], [place] woman status post left breast biopsy 02/13/2020 for ductal carcinoma in situ, grade 2, strongly estrogen and progesterone receptor positive  (a) the patient originally palpated a mass, subsequent ultrasound did not document a mass  (1) patient opted for intensified screening with no surgery or radiation (and opted against COMET)  (a) mammography every April  (b) breast MRI every October  (2) anastrozole started 02/22/2020  (a) bone density scan at [facility] 03/06/2021 showed a T score of -2.2  PLAN: [name] is now 1-1/2 years out from definitive diagnosis of noninvasive breast cancer.  She opted against surgery or radiation and is receiving anastrozole as her only treatment.  Accordingly we are following her with imaging every 6 months and she just had her mammogram which is fine.  Once her breast density decreases perhaps to a A density we could dispense with the breast MRI.  Her next imaging is scheduled for June and she will see [place]  again in July.  She also receives denosumab/Xgeva for her significant osteopenia.  She has a dose later this month and then again in June.  She will be due for repeat bone density in June 2024  Total encounter time 25 minutes.Sarajane Jews C. Eutha Cude, MD 09/26/2021 8:41 AM Medical Oncology and Hematology [facility] [place], [place] [address] Tel. [phone]    Fax. [phone]   This document serves as a record of services personally performed by Lurline Del, MD. It  was created on his behalf by Wilburn Mylar, a trained medical scribe. The creation of this record is based on the scribe's personal observations and the provider's statements to them.   I, Lurline Del MD, have reviewed the above documentation for accuracy and completeness, and I agree with the above.   *Total Encounter Time as defined by the Centers for Medicare and Medicaid Services includes, in addition to the face-to-face time of a patient visit (documented in the note above) non-face-to-face time: obtaining and reviewing outside history, ordering and reviewing medications, tests or procedures, care coordination (communications with other health care professionals or caregivers) and documentation in the medical record.

## 2021-09-26 ENCOUNTER — Inpatient Hospital Stay: Payer: Medicare PPO | Admitting: Oncology

## 2021-09-27 ENCOUNTER — Other Ambulatory Visit: Payer: Self-pay

## 2021-09-27 DIAGNOSIS — D0511 Intraductal carcinoma in situ of right breast: Secondary | ICD-10-CM

## 2021-10-01 ENCOUNTER — Other Ambulatory Visit: Payer: Self-pay

## 2021-10-01 ENCOUNTER — Inpatient Hospital Stay: Payer: Medicare PPO

## 2021-10-01 VITALS — BP 154/78 | HR 89 | Resp 18

## 2021-10-01 DIAGNOSIS — D0511 Intraductal carcinoma in situ of right breast: Secondary | ICD-10-CM

## 2021-10-01 DIAGNOSIS — M8588 Other specified disorders of bone density and structure, other site: Secondary | ICD-10-CM

## 2021-10-01 LAB — CBC WITH DIFFERENTIAL (CANCER CENTER ONLY)
Abs Immature Granulocytes: 0.01 10*3/uL (ref 0.00–0.07)
Basophils Absolute: 0.1 10*3/uL (ref 0.0–0.1)
Basophils Relative: 1 %
Eosinophils Absolute: 0.1 10*3/uL (ref 0.0–0.5)
Eosinophils Relative: 2 %
HCT: 45 % (ref 36.0–46.0)
Hemoglobin: 14.5 g/dL (ref 12.0–15.0)
Immature Granulocytes: 0 %
Lymphocytes Relative: 25 %
Lymphs Abs: 1.6 10*3/uL (ref 0.7–4.0)
MCH: 27.9 pg (ref 26.0–34.0)
MCHC: 32.2 g/dL (ref 30.0–36.0)
MCV: 86.7 fL (ref 80.0–100.0)
Monocytes Absolute: 0.7 10*3/uL (ref 0.1–1.0)
Monocytes Relative: 11 %
Neutro Abs: 3.9 10*3/uL (ref 1.7–7.7)
Neutrophils Relative %: 61 %
Platelet Count: 257 10*3/uL (ref 150–400)
RBC: 5.19 MIL/uL — ABNORMAL HIGH (ref 3.87–5.11)
RDW: 16.4 % — ABNORMAL HIGH (ref 11.5–15.5)
WBC Count: 6.4 10*3/uL (ref 4.0–10.5)
nRBC: 0 % (ref 0.0–0.2)

## 2021-10-01 LAB — CMP (CANCER CENTER ONLY)
ALT: 16 U/L (ref 0–44)
AST: 22 U/L (ref 15–41)
Albumin: 4.3 g/dL (ref 3.5–5.0)
Alkaline Phosphatase: 50 U/L (ref 38–126)
Anion gap: 7 (ref 5–15)
BUN: 18 mg/dL (ref 8–23)
CO2: 26 mmol/L (ref 22–32)
Calcium: 9.5 mg/dL (ref 8.9–10.3)
Chloride: 99 mmol/L (ref 98–111)
Creatinine: 1.39 mg/dL — ABNORMAL HIGH (ref 0.44–1.00)
GFR, Estimated: 38 mL/min — ABNORMAL LOW (ref >60)
Glucose, Bld: 131 mg/dL — ABNORMAL HIGH (ref 70–99)
Potassium: 5.1 mmol/L (ref 3.5–5.1)
Sodium: 132 mmol/L — ABNORMAL LOW (ref 135–145)
Total Bilirubin: 0.3 mg/dL (ref 0.3–1.2)
Total Protein: 7.5 g/dL (ref 6.5–8.1)

## 2021-10-01 MED ORDER — DENOSUMAB 60 MG/ML ~~LOC~~ SOSY
60.0000 mg | PREFILLED_SYRINGE | Freq: Once | SUBCUTANEOUS | Status: AC
  Administered 2021-10-01: 15:00:00 60 mg via SUBCUTANEOUS
  Filled 2021-10-01: qty 1

## 2021-10-01 NOTE — Progress Notes (Signed)
Per Wilber Bihari, NP ok to proceed with prolia injection today.

## 2021-12-11 DIAGNOSIS — I671 Cerebral aneurysm, nonruptured: Secondary | ICD-10-CM | POA: Insufficient documentation

## 2021-12-18 ENCOUNTER — Telehealth: Payer: Self-pay

## 2021-12-18 NOTE — Telephone Encounter (Signed)
Patient called and would like to set up an apt with Dr. Ernestina Patches. Please call and advise  ?

## 2021-12-24 ENCOUNTER — Telehealth: Payer: Self-pay | Admitting: Physical Medicine and Rehabilitation

## 2021-12-24 NOTE — Telephone Encounter (Signed)
Patient confirmed appointment.

## 2022-01-08 ENCOUNTER — Telehealth: Payer: Self-pay | Admitting: Physical Medicine and Rehabilitation

## 2022-01-08 ENCOUNTER — Ambulatory Visit: Payer: Medicare PPO | Admitting: Physical Medicine and Rehabilitation

## 2022-01-08 NOTE — Telephone Encounter (Signed)
Patient called needing to cancel and reschedule her appointment for the injection at a later date. Patient said she is having surgery and trying to get her B/P down before Wednesday.  Ph# [phone] ?

## 2022-02-17 NOTE — Telephone Encounter (Signed)
error 

## 2022-03-04 ENCOUNTER — Other Ambulatory Visit: Payer: Self-pay | Admitting: *Deleted

## 2022-03-04 DIAGNOSIS — D0511 Intraductal carcinoma in situ of right breast: Secondary | ICD-10-CM

## 2022-03-04 DIAGNOSIS — C50919 Malignant neoplasm of unspecified site of unspecified female breast: Secondary | ICD-10-CM

## 2022-03-19 ENCOUNTER — Encounter: Payer: Self-pay | Admitting: Adult Health

## 2022-03-31 ENCOUNTER — Other Ambulatory Visit: Payer: Self-pay

## 2022-03-31 DIAGNOSIS — D0511 Intraductal carcinoma in situ of right breast: Secondary | ICD-10-CM

## 2022-04-01 ENCOUNTER — Ambulatory Visit: Payer: Medicare PPO | Admitting: Adult Health

## 2022-04-01 ENCOUNTER — Inpatient Hospital Stay: Payer: Medicare PPO

## 2022-04-01 ENCOUNTER — Telehealth: Payer: Self-pay

## 2022-04-01 ENCOUNTER — Encounter: Payer: Self-pay | Admitting: Adult Health

## 2022-04-01 ENCOUNTER — Inpatient Hospital Stay: Payer: Medicare PPO | Attending: Adult Health

## 2022-04-01 ENCOUNTER — Other Ambulatory Visit: Payer: Medicare PPO

## 2022-04-01 ENCOUNTER — Other Ambulatory Visit: Payer: Self-pay

## 2022-04-01 ENCOUNTER — Ambulatory Visit: Payer: Medicare PPO

## 2022-04-01 ENCOUNTER — Inpatient Hospital Stay (HOSPITAL_BASED_OUTPATIENT_CLINIC_OR_DEPARTMENT_OTHER): Payer: Medicare PPO | Admitting: Adult Health

## 2022-04-01 VITALS — BP 172/72 | HR 69 | Resp 18 | Ht 60.0 in | Wt 177.5 lb

## 2022-04-01 DIAGNOSIS — D0511 Intraductal carcinoma in situ of right breast: Secondary | ICD-10-CM | POA: Insufficient documentation

## 2022-04-01 DIAGNOSIS — R011 Cardiac murmur, unspecified: Secondary | ICD-10-CM | POA: Diagnosis not present

## 2022-04-01 DIAGNOSIS — Z170 Estrogen receptor positive status [ER+]: Secondary | ICD-10-CM | POA: Insufficient documentation

## 2022-04-01 DIAGNOSIS — M8588 Other specified disorders of bone density and structure, other site: Secondary | ICD-10-CM | POA: Diagnosis not present

## 2022-04-01 DIAGNOSIS — Z79811 Long term (current) use of aromatase inhibitors: Secondary | ICD-10-CM | POA: Insufficient documentation

## 2022-04-01 DIAGNOSIS — E871 Hypo-osmolality and hyponatremia: Secondary | ICD-10-CM | POA: Insufficient documentation

## 2022-04-01 DIAGNOSIS — Z9049 Acquired absence of other specified parts of digestive tract: Secondary | ICD-10-CM | POA: Insufficient documentation

## 2022-04-01 DIAGNOSIS — Z8719 Personal history of other diseases of the digestive system: Secondary | ICD-10-CM | POA: Diagnosis not present

## 2022-04-01 LAB — CMP (CANCER CENTER ONLY)
ALT: 13 U/L (ref 0–44)
AST: 17 U/L (ref 15–41)
Albumin: 4.2 g/dL (ref 3.5–5.0)
Alkaline Phosphatase: 52 U/L (ref 38–126)
Anion gap: 6 (ref 5–15)
BUN: 21 mg/dL (ref 8–23)
CO2: 28 mmol/L (ref 22–32)
Calcium: 9.6 mg/dL (ref 8.9–10.3)
Chloride: 101 mmol/L (ref 98–111)
Creatinine: 1.28 mg/dL — ABNORMAL HIGH (ref 0.44–1.00)
GFR, Estimated: 41 mL/min — ABNORMAL LOW (ref >60)
Glucose, Bld: 117 mg/dL — ABNORMAL HIGH (ref 70–99)
Potassium: 5.2 mmol/L — ABNORMAL HIGH (ref 3.5–5.1)
Sodium: 135 mmol/L (ref 135–145)
Total Bilirubin: 0.6 mg/dL (ref 0.3–1.2)
Total Protein: 7.2 g/dL (ref 6.5–8.1)

## 2022-04-01 LAB — CBC WITH DIFFERENTIAL (CANCER CENTER ONLY)
Abs Immature Granulocytes: 0.01 10*3/uL (ref 0.00–0.07)
Basophils Absolute: 0.1 10*3/uL (ref 0.0–0.1)
Basophils Relative: 1 %
Eosinophils Absolute: 0.3 10*3/uL (ref 0.0–0.5)
Eosinophils Relative: 4 %
HCT: 40.1 % (ref 36.0–46.0)
Hemoglobin: 13.2 g/dL (ref 12.0–15.0)
Immature Granulocytes: 0 %
Lymphocytes Relative: 29 %
Lymphs Abs: 2.1 10*3/uL (ref 0.7–4.0)
MCH: 27.7 pg (ref 26.0–34.0)
MCHC: 32.9 g/dL (ref 30.0–36.0)
MCV: 84.1 fL (ref 80.0–100.0)
Monocytes Absolute: 0.7 10*3/uL (ref 0.1–1.0)
Monocytes Relative: 9 %
Neutro Abs: 3.9 10*3/uL (ref 1.7–7.7)
Neutrophils Relative %: 57 %
Platelet Count: 241 10*3/uL (ref 150–400)
RBC: 4.77 MIL/uL (ref 3.87–5.11)
RDW: 17.3 % — ABNORMAL HIGH (ref 11.5–15.5)
WBC Count: 7 10*3/uL (ref 4.0–10.5)
nRBC: 0 % (ref 0.0–0.2)

## 2022-04-01 MED ORDER — DENOSUMAB 60 MG/ML ~~LOC~~ SOSY
60.0000 mg | PREFILLED_SYRINGE | Freq: Once | SUBCUTANEOUS | Status: AC
  Administered 2022-04-01: 60 mg via SUBCUTANEOUS
  Filled 2022-04-01: qty 1

## 2022-04-01 NOTE — Progress Notes (Signed)
[facility] Follow up:    [name] [address] [place] [address]   DIAGNOSIS:  Cancer Staging  Ductal carcinoma in situ (DCIS) of right breast Staging form: Breast, AJCC 8th Edition - Clinical stage from 02/22/2020: Stage 0 (cTis (DCIS), cN0, cM0) - Unsigned Stage prefix: Initial diagnosis   SUMMARY OF ONCOLOGIC HISTORY: Holly Simpson, Holly Simpson woman status post right breast biopsy 02/13/2020 for ductal carcinoma in situ, grade 2, strongly estrogen and progesterone receptor positive             (a) the patient originally palpated a mass, subsequent ultrasound did not document a mass   (1) patient opted for intensified screening with no surgery or radiation (and opted against COMET)             (a) mammography every April             (b) breast MRI every October   (2) anastrozole started 02/22/2020             (a) bone density scan at [facility] 03/06/2021 showed a T score of -2.2  (B) on prolia every 6 months  CURRENT THERAPY: Anastrozole, Prolia  INTERVAL HISTORY: [name] 86 y.o. female returns for follow-up of her noninvasive breast cancer.  She is currently taking anastrozole daily for this.  We are monitoring her closely as she has not had surgery with mammogram and MRI.  Her most recent mammogram was completed on March 19, 2022 and showed biopsy-proven calcifications in the right breast with no significant interval change and no other suspicious calcifications asymmetries or masses in either breast.  Holly Simpson notes that she is doing well and tolerates the anastrozole without any significant issues.  She has no concerns.    Patient Active Problem List   Diagnosis Date Noted   Osteopenia of spine 03/25/2021   Osteoarthritis of left hip 09/20/2020   Ductal carcinoma in situ (DCIS) of right breast 02/16/2020   H/O acute pancreatitis 11/30/2019   High cholesterol 11/30/2019   Hyponatremia 03/16/2019   Prediabetes 09/10/2018    Hyperglycemia 05/10/2018   Pain in both lower legs 01/06/2018   Carpal tunnel syndrome of right wrist 10/09/2017   Numbness and tingling in right hand 04/29/2017   Encounter for health maintenance examination 05/06/2016   Impaired fasting glucose 05/06/2016   Nuclear sclerotic cataract of left eye 04/03/2016   Age-related nuclear cataract of both eyes 10/28/2015   Chronic kidney disease, stage III (moderate) ([place]) 10/28/2015   Dermatochalasis of both upper eyelids 10/28/2015   GERD without esophagitis 10/28/2015   Hearing loss 10/28/2015   High risk medication use 10/28/2015   HSV-1 infection 10/28/2015   Hyperkalemia 10/28/2015   Keratoconjunctivitis sicca of both eyes not specified as Sjogren's 10/28/2015   Neck pain 10/28/2015   Class 2 severe obesity due to excess calories with serious comorbidity and body mass index (BMI) of 35.0 to 35.9 in adult [facility]) 10/28/2015   Osteoarthritis 10/28/2015   Vitreous floaters of right eye 10/28/2015   High blood pressure 06/19/2014   Hypothyroidism 06/19/2014   Primary osteoarthritis of right knee 06/19/2014   Vitamin D deficiency 06/19/2014    has No Known Allergies.  MEDICAL HISTORY: Past Medical History:  Diagnosis Date   Arthritis    Asthma    Cancer ([place])    skin ankle,breast   Hypertension    Pre-diabetes     SURGICAL HISTORY: Past Surgical History:  Procedure Laterality  Date   APPENDECTOMY     BACK SURGERY     L3-4   CHOLECYSTECTOMY     TONSILLECTOMY     TOTAL HIP ARTHROPLASTY Left 09/20/2020   Procedure: TOTAL HIP ARTHROPLASTY ANTERIOR APPROACH;  Surgeon: Rod Can, MD;  Location: [facility];  Service: Orthopedics;  Laterality: Left;   triguer finguer      SOCIAL HISTORY: Social History   Socioeconomic History   Marital status: Single    Spouse name: Not on file   Number of children: Not on file   Years of education: Not on file   Highest education level: Not on file  Occupational History   Not on file   Tobacco Use   Smoking status: Former   Smokeless tobacco: Never   Tobacco comments:    Quit 20 years ago  Vaping Use   Vaping Use: Never used  Substance and Sexual Activity   Alcohol use: Yes    Comment: occas.   Drug use: Never   Sexual activity: Not on file  Other Topics Concern   Not on file  Social History Narrative   Not on file   Social Determinants of Health   Financial Resource Strain: Not on file  Food Insecurity: Not on file  Transportation Needs: Not on file  Physical Activity: Not on file  Stress: Not on file  Social Connections: Not on file  Intimate Partner Violence: Not on file    FAMILY HISTORY: History reviewed. No pertinent family history.  Review of Systems  Constitutional:  Negative for appetite change, chills, fatigue, fever and unexpected weight change.  HENT:   Negative for hearing loss, lump/mass and trouble swallowing.   Eyes:  Negative for eye problems and icterus.  Respiratory:  Negative for chest tightness, cough and shortness of breath.   Cardiovascular:  Negative for chest pain, leg swelling and palpitations.  Gastrointestinal:  Negative for abdominal distention, abdominal pain, constipation, diarrhea, nausea and vomiting.  Endocrine: Negative for hot flashes.  Genitourinary:  Negative for difficulty urinating.   Musculoskeletal:  Negative for arthralgias.  Skin:  Negative for itching and rash.  Neurological:  Negative for dizziness, extremity weakness, headaches and numbness.  Hematological:  Negative for adenopathy. Does not bruise/bleed easily.  Psychiatric/Behavioral:  Negative for depression. The patient is not nervous/anxious.       PHYSICAL EXAMINATION  ECOG PERFORMANCE STATUS: 0 - Asymptomatic  Vitals:   04/01/22 1227  BP: (!) 172/72  Pulse: 69  Resp: 18  SpO2: 98%    Physical Exam Constitutional:      General: She is not in acute distress.    Appearance: Normal appearance. She is not toxic-appearing.  HENT:      Head: Normocephalic and atraumatic.  Eyes:     General: No scleral icterus. Cardiovascular:     Rate and Rhythm: Normal rate and regular rhythm.     Pulses: Normal pulses.     Heart sounds: Normal heart sounds.  Pulmonary:     Effort: Pulmonary effort is normal.     Breath sounds: Normal breath sounds.  Chest:     Comments: Unable to palpate a left breast mass. Abdominal:     General: Abdomen is flat. Bowel sounds are normal. There is no distension.     Palpations: Abdomen is soft.     Tenderness: There is no abdominal tenderness.  Musculoskeletal:        General: No swelling.     Cervical back: Neck supple.  Lymphadenopathy:  Cervical: No cervical adenopathy.  Skin:    General: Skin is warm and dry.     Findings: No rash.  Neurological:     General: No focal deficit present.     Mental Status: She is alert.  Psychiatric:        Mood and Affect: Mood normal.        Behavior: Behavior normal.     LABORATORY DATA:  CBC    Component Value Date/Time   WBC 7.0 04/01/2022 1136   WBC 9.2 09/23/2020 0251   RBC 4.77 04/01/2022 1136   HGB 13.2 04/01/2022 1136   HCT 40.1 04/01/2022 1136   PLT 241 04/01/2022 1136   MCV 84.1 04/01/2022 1136   MCH 27.7 04/01/2022 1136   MCHC 32.9 04/01/2022 1136   RDW 17.3 (H) 04/01/2022 1136   LYMPHSABS 2.1 04/01/2022 1136   MONOABS 0.7 04/01/2022 1136   EOSABS 0.3 04/01/2022 1136   BASOSABS 0.1 04/01/2022 1136    CMP     Component Value Date/Time   NA 135 04/01/2022 1136   K 5.2 (H) 04/01/2022 1136   CL 101 04/01/2022 1136   CO2 28 04/01/2022 1136   GLUCOSE 117 (H) 04/01/2022 1136   BUN 21 04/01/2022 1136   CREATININE 1.28 (H) 04/01/2022 1136   CALCIUM 9.6 04/01/2022 1136   PROT 7.2 04/01/2022 1136   ALBUMIN 4.2 04/01/2022 1136   AST 17 04/01/2022 1136   ALT 13 04/01/2022 1136   ALKPHOS 52 04/01/2022 1136   BILITOT 0.6 04/01/2022 1136   GFRNONAA 41 (L) 04/01/2022 1136   GFRAA 38 (L) 02/22/2020 1221      ASSESSMENT  and THERAPY PLAN:   Ductal carcinoma in situ (DCIS) of right breast Holly Simpson is an 86 year old woman with DCIS in her right breast who has opted to forego surgery and radiation along with the Comet clinical trial and is taking anastrozole daily beginning in May 2021.  She has no clinical or radiographic sign of progression of her noninvasive breast cancer.  She is undergoing intensified screening with mammogram every April and breast MRI every October to closely monitor her breast cancer.  I discussed healthy diet and exercise in detail with Holly Simpson which she will continue.  She is tolerating anastrozole well and will continue this.  Her most recent bone density was completed in June 2022 it will be due again in June 2024.  Because she has osteopenia with a T score of -2.2 we are giving her Prolia every 6 months and we will see her on the day of her visit for Prolia along with reviewing labs and her recent imaging from mammography and breast MRI.  Holly Simpson is in agreement with the above plan and we discussed in detail.  We will see her back in 6 months time.    All questions were answered. The patient knows to call the clinic with any problems, questions or concerns. We can certainly see the patient much sooner if necessary.  Total encounter time:20 minutes*in face-to-face visit time, chart review, lab review, care coordination, order entry, and documentation of the encounter time.    Wilber Bihari, NP 04/04/22 9:17 AM Medical Oncology and Hematology [facility] [place],  [address] Tel. [phone]    Fax. [phone]  *Total Encounter Time as defined by the Centers for Medicare and Medicaid Services includes, in addition to the face-to-face time of a patient visit (documented in the note above) non-face-to-face time: obtaining and reviewing outside history,  ordering and reviewing medications, tests or procedures, care coordination (communications with  other health care professionals or caregivers) and documentation in the medical record.

## 2022-04-02 ENCOUNTER — Telehealth: Payer: Self-pay | Admitting: Adult Health

## 2022-04-02 NOTE — Telephone Encounter (Signed)
Scheduled appointment per 6/27 los. Left message.

## 2022-04-04 ENCOUNTER — Encounter: Payer: Self-pay | Admitting: Adult Health

## 2022-04-04 NOTE — Assessment & Plan Note (Addendum)
Holly Simpson is an 86 year old woman with DCIS in her right breast who has opted to forego surgery and radiation along with the Comet clinical trial and is taking anastrozole daily beginning in May 2021.  She has no clinical or radiographic sign of progression of her noninvasive breast cancer.  She is undergoing intensified screening with mammogram every April and breast MRI every October to closely monitor her breast cancer.  I discussed healthy diet and exercise in detail with Holly Simpson which she will continue.  She is tolerating anastrozole well and will continue this.  Her most recent bone density was completed in June 2022 it will be due again in June 2024.  Because she has osteopenia with a T score of -2.2 we are giving her Prolia every 6 months and we will see her on the day of her visit for Prolia along with reviewing labs and her recent imaging from mammography and breast MRI.  Holly Simpson is in agreement with the above plan and we discussed in detail.  We will see her back in 6 months time.

## 2022-04-11 ENCOUNTER — Ambulatory Visit (HOSPITAL_COMMUNITY)
Admission: RE | Admit: 2022-04-11 | Discharge: 2022-04-11 | Disposition: A | Discharge status: Home / Self Care | Payer: Medicare PPO | Source: Ambulatory Visit | Attending: Adult Health | Admitting: Adult Health

## 2022-04-11 DIAGNOSIS — I358 Other nonrheumatic aortic valve disorders: Secondary | ICD-10-CM | POA: Diagnosis not present

## 2022-04-11 DIAGNOSIS — I10 Essential (primary) hypertension: Secondary | ICD-10-CM | POA: Insufficient documentation

## 2022-04-11 DIAGNOSIS — R011 Cardiac murmur, unspecified: Secondary | ICD-10-CM | POA: Diagnosis not present

## 2022-04-11 DIAGNOSIS — E785 Hyperlipidemia, unspecified: Secondary | ICD-10-CM | POA: Diagnosis not present

## 2022-04-11 LAB — ECHOCARDIOGRAM COMPLETE
AR max vel: 2.84 cm2
AV Area VTI: 2.77 cm2
AV Area mean vel: 2.74 cm2
AV Mean grad: 6.4 mmHg
AV Peak grad: 10.9 mmHg
Ao pk vel: 1.65 m/s
Area-P 1/2: 3.19 cm2
Calc EF: 64.2 %
S' Lateral: 2.9 cm
Single Plane A2C EF: 59.9 %
Single Plane A4C EF: 68.3 %

## 2022-04-11 NOTE — Progress Notes (Signed)
  Echocardiogram 2D Echocardiogram has been performed.  Holly Simpson 04/11/2022, 11:29 AM

## 2022-04-22 ENCOUNTER — Ambulatory Visit: Payer: Medicare PPO

## 2022-04-22 ENCOUNTER — Ambulatory Visit: Payer: Medicare PPO | Admitting: Hematology and Oncology

## 2022-04-23 ENCOUNTER — Ambulatory Visit: Payer: Medicare PPO

## 2022-04-23 ENCOUNTER — Ambulatory Visit: Payer: Medicare PPO | Admitting: Hematology and Oncology

## 2022-07-07 ENCOUNTER — Ambulatory Visit
Admission: RE | Admit: 2022-07-07 | Discharge: 2022-07-07 | Disposition: A | Discharge status: Home / Self Care | Payer: Medicare PPO | Source: Ambulatory Visit | Attending: Adult Health | Admitting: Adult Health

## 2022-07-07 DIAGNOSIS — D0511 Intraductal carcinoma in situ of right breast: Secondary | ICD-10-CM

## 2022-07-07 MED ORDER — GADOBUTROL 1 MMOL/ML IV SOLN
8.0000 mL | Freq: Once | INTRAVENOUS | Status: AC | PRN
  Administered 2022-07-07: 8 mL via INTRAVENOUS

## 2022-07-08 ENCOUNTER — Telehealth: Payer: Self-pay | Admitting: Physical Medicine and Rehabilitation

## 2022-07-08 NOTE — Telephone Encounter (Signed)
Pt called and was wondering if she can get bil shoulder injections.

## 2022-07-08 NOTE — Telephone Encounter (Signed)
I called, unable to leave voicemail. Will need additional information from patient.

## 2022-07-08 NOTE — Telephone Encounter (Signed)
Patient was here. Would like an appointment with Dr. Ernestina Patches. Her number is [phone]

## 2022-07-09 NOTE — Telephone Encounter (Signed)
Called pt and had to LVM

## 2022-07-09 NOTE — Telephone Encounter (Signed)
IC again, unable to leave voicemail. Will need additional information from patient.

## 2022-07-15 ENCOUNTER — Other Ambulatory Visit: Payer: Self-pay | Admitting: *Deleted

## 2022-07-15 MED ORDER — ANASTROZOLE 1 MG PO TABS: 1.0000 mg | ORAL_TABLET | Freq: Every day | ORAL | 1 refills | Status: DC

## 2022-07-21 ENCOUNTER — Other Ambulatory Visit: Payer: Self-pay

## 2022-07-21 MED ORDER — ANASTROZOLE 1 MG PO TABS: 1.0000 mg | ORAL_TABLET | Freq: Every day | ORAL | 1 refills | Status: DC

## 2022-07-21 NOTE — Telephone Encounter (Signed)
Patient called requesting refill on Anastrozol. Next appointment at [facility] is 10/02/2022.

## 2022-07-23 ENCOUNTER — Other Ambulatory Visit: Payer: Self-pay

## 2022-07-23 ENCOUNTER — Telehealth: Payer: Self-pay

## 2022-07-23 MED ORDER — ANASTROZOLE 1 MG PO TABS: 1.0000 mg | ORAL_TABLET | Freq: Every day | ORAL | 5 refills | Status: DC

## 2022-07-23 NOTE — Telephone Encounter (Signed)
Attempted to call her back. She called and left a message requesting 30 day supply supply due to insurance. Left 30 day supply instead of 90 day supply. Rx sent.

## 2022-09-30 ENCOUNTER — Other Ambulatory Visit: Payer: Self-pay

## 2022-09-30 DIAGNOSIS — D0511 Intraductal carcinoma in situ of right breast: Secondary | ICD-10-CM

## 2022-10-02 ENCOUNTER — Inpatient Hospital Stay: Payer: Medicare PPO

## 2022-10-02 ENCOUNTER — Inpatient Hospital Stay: Payer: Medicare PPO | Attending: Adult Health

## 2022-10-02 ENCOUNTER — Telehealth: Payer: Self-pay

## 2022-10-02 ENCOUNTER — Inpatient Hospital Stay: Payer: Medicare PPO | Admitting: Adult Health

## 2022-10-02 NOTE — Telephone Encounter (Signed)
Called and left another message for Pt to call back regarding no show appts.  Spoke with emergency contact Holly Simpson who reports hearing from Pt today and that Pt is ok and most likely forgot about appts. Asked Mickey to have Pt call when she can.

## 2022-10-02 NOTE — Telephone Encounter (Signed)
Called and LVM on home phone per Wilber Bihari, NP related to no show appts. Asked Pt for a call back.

## 2022-10-07 ENCOUNTER — Telehealth: Payer: Self-pay | Admitting: Adult Health

## 2022-10-07 NOTE — Telephone Encounter (Signed)
Scheduled appointment per 12/28 staff message. Patient is aware.

## 2022-10-13 ENCOUNTER — Encounter: Payer: Self-pay | Admitting: Adult Health

## 2022-10-13 ENCOUNTER — Other Ambulatory Visit: Payer: Self-pay

## 2022-10-13 ENCOUNTER — Inpatient Hospital Stay: Payer: Medicare PPO | Attending: Adult Health

## 2022-10-13 ENCOUNTER — Inpatient Hospital Stay: Payer: Medicare PPO

## 2022-10-13 ENCOUNTER — Inpatient Hospital Stay: Payer: Medicare PPO | Admitting: Adult Health

## 2022-10-13 VITALS — BP 139/64 | HR 80 | Temp 97.7°F | Resp 18 | Wt 174.2 lb

## 2022-10-13 DIAGNOSIS — D0511 Intraductal carcinoma in situ of right breast: Secondary | ICD-10-CM | POA: Diagnosis not present

## 2022-10-13 DIAGNOSIS — M8588 Other specified disorders of bone density and structure, other site: Secondary | ICD-10-CM | POA: Diagnosis not present

## 2022-10-13 DIAGNOSIS — Z8719 Personal history of other diseases of the digestive system: Secondary | ICD-10-CM | POA: Insufficient documentation

## 2022-10-13 DIAGNOSIS — Z79811 Long term (current) use of aromatase inhibitors: Secondary | ICD-10-CM | POA: Diagnosis not present

## 2022-10-13 DIAGNOSIS — N6314 Unspecified lump in the right breast, lower inner quadrant: Secondary | ICD-10-CM | POA: Diagnosis not present

## 2022-10-13 DIAGNOSIS — Z6835 Body mass index (BMI) 35.0-35.9, adult: Secondary | ICD-10-CM | POA: Insufficient documentation

## 2022-10-13 DIAGNOSIS — Z79899 Other long term (current) drug therapy: Secondary | ICD-10-CM | POA: Insufficient documentation

## 2022-10-13 DIAGNOSIS — Z9049 Acquired absence of other specified parts of digestive tract: Secondary | ICD-10-CM | POA: Insufficient documentation

## 2022-10-13 LAB — CBC WITH DIFFERENTIAL (CANCER CENTER ONLY)
Abs Immature Granulocytes: 0.02 10*3/uL (ref 0.00–0.07)
Basophils Absolute: 0.1 10*3/uL (ref 0.0–0.1)
Basophils Relative: 1 %
Eosinophils Absolute: 0.2 10*3/uL (ref 0.0–0.5)
Eosinophils Relative: 3 %
HCT: 43.4 % (ref 36.0–46.0)
Hemoglobin: 14.4 g/dL (ref 12.0–15.0)
Immature Granulocytes: 0 %
Lymphocytes Relative: 23 %
Lymphs Abs: 1.6 10*3/uL (ref 0.7–4.0)
MCH: 28.8 pg (ref 26.0–34.0)
MCHC: 33.2 g/dL (ref 30.0–36.0)
MCV: 86.8 fL (ref 80.0–100.0)
Monocytes Absolute: 0.6 10*3/uL (ref 0.1–1.0)
Monocytes Relative: 9 %
Neutro Abs: 4.4 10*3/uL (ref 1.7–7.7)
Neutrophils Relative %: 64 %
Platelet Count: 264 10*3/uL (ref 150–400)
RBC: 5 MIL/uL (ref 3.87–5.11)
RDW: 15.7 % — ABNORMAL HIGH (ref 11.5–15.5)
WBC Count: 6.9 10*3/uL (ref 4.0–10.5)
nRBC: 0 % (ref 0.0–0.2)

## 2022-10-13 LAB — CMP (CANCER CENTER ONLY)
ALT: 15 U/L (ref 0–44)
AST: 20 U/L (ref 15–41)
Albumin: 4.3 g/dL (ref 3.5–5.0)
Alkaline Phosphatase: 61 U/L (ref 38–126)
Anion gap: 9 (ref 5–15)
BUN: 25 mg/dL — ABNORMAL HIGH (ref 8–23)
CO2: 28 mmol/L (ref 22–32)
Calcium: 9.7 mg/dL (ref 8.9–10.3)
Chloride: 95 mmol/L — ABNORMAL LOW (ref 98–111)
Creatinine: 1.6 mg/dL — ABNORMAL HIGH (ref 0.44–1.00)
GFR, Estimated: 32 mL/min — ABNORMAL LOW (ref >60)
Glucose, Bld: 120 mg/dL — ABNORMAL HIGH (ref 70–99)
Potassium: 4 mmol/L (ref 3.5–5.1)
Sodium: 132 mmol/L — ABNORMAL LOW (ref 135–145)
Total Bilirubin: 0.3 mg/dL (ref 0.3–1.2)
Total Protein: 7.2 g/dL (ref 6.5–8.1)

## 2022-10-13 MED ORDER — DENOSUMAB 60 MG/ML ~~LOC~~ SOSY
60.0000 mg | PREFILLED_SYRINGE | Freq: Once | SUBCUTANEOUS | Status: AC
  Administered 2022-10-13: 60 mg via SUBCUTANEOUS
  Filled 2022-10-13: qty 1

## 2022-10-13 NOTE — Progress Notes (Unsigned)
[facility] Follow up:    Holly Simpson [address] [place] [address]   DIAGNOSIS: Cancer Staging  Ductal carcinoma in situ (DCIS) of right breast Staging form: Breast, AJCC 8th Edition - Clinical stage from 02/22/2020: Stage 0 (cTis (DCIS), cN0, cM0) - Unsigned Stage prefix: Initial diagnosis   SUMMARY OF ONCOLOGIC HISTORY:   CURRENT THERAPY:  [place], [place] woman status post right breast biopsy 02/13/2020 for ductal carcinoma in situ, grade 2, strongly estrogen and progesterone receptor positive             (a) the patient originally palpated a mass, subsequent ultrasound did not document a mass   (1) patient opted for intensified screening with no surgery or radiation (and opted against COMET)             (a) mammography every April             (b) breast MRI every October   (2) anastrozole started 02/22/2020             (a) bone density scan at [facility] 03/06/2021 showed a T score of -2.2             (B) on prolia every 6 months    INTERVAL HISTORY: Holly Simpson 87 y.o. female returns for   Her most recent breast MRI on 07/07/2022 demonstrated: 1. Interval decrease in size of a small enhancing mass in the lower inner right breast, now measuring 0.4 cm, at the site of biopsy proven DCIS. 2. No MRI evidence of malignancy in the left breast.  Patient Active Problem List   Diagnosis Date Noted   Osteopenia of spine 03/25/2021   Osteoarthritis of left hip 09/20/2020   Ductal carcinoma in situ (DCIS) of right breast 02/16/2020   H/O acute pancreatitis 11/30/2019   High cholesterol 11/30/2019   Hyponatremia 03/16/2019   Prediabetes 09/10/2018   Hyperglycemia 05/10/2018   Pain in both lower legs 01/06/2018   Carpal tunnel syndrome of right wrist 10/09/2017   Numbness and tingling in right hand 04/29/2017   Encounter for health maintenance examination 05/06/2016   Impaired fasting glucose 05/06/2016   Nuclear sclerotic  cataract of left eye 04/03/2016   Age-related nuclear cataract of both eyes 10/28/2015   Chronic kidney disease, stage III (moderate) ([place]) 10/28/2015   Dermatochalasis of both upper eyelids 10/28/2015   GERD without esophagitis 10/28/2015   Hearing loss 10/28/2015   High risk medication use 10/28/2015   HSV-1 infection 10/28/2015   Hyperkalemia 10/28/2015   Keratoconjunctivitis sicca of both eyes not specified as Sjogren's 10/28/2015   Neck pain 10/28/2015   Class 2 severe obesity due to excess calories with serious comorbidity and body mass index (BMI) of 35.0 to 35.9 in adult [facility]) 10/28/2015   Osteoarthritis 10/28/2015   Vitreous floaters of right eye 10/28/2015   High blood pressure 06/19/2014   Hypothyroidism 06/19/2014   Primary osteoarthritis of right knee 06/19/2014   Vitamin D deficiency 06/19/2014    has No Known Allergies.  MEDICAL HISTORY: Past Medical History:  Diagnosis Date   Arthritis    Asthma    Cancer ([place])    skin ankle,breast   Hypertension    Pre-diabetes     SURGICAL HISTORY: Past Surgical History:  Procedure Laterality Date   APPENDECTOMY     BACK SURGERY     L3-4   CHOLECYSTECTOMY     TONSILLECTOMY     TOTAL HIP ARTHROPLASTY  Left 09/20/2020   Procedure: TOTAL HIP ARTHROPLASTY ANTERIOR APPROACH;  Surgeon: Rod Can, MD;  Location: [facility];  Service: Orthopedics;  Laterality: Left;   triguer finguer      SOCIAL HISTORY: Social History   Socioeconomic History   Marital status: Single    Spouse name: Not on file   Number of children: Not on file   Years of education: Not on file   Highest education level: Not on file  Occupational History   Not on file  Tobacco Use   Smoking status: Former   Smokeless tobacco: Never   Tobacco comments:    Quit 20 years ago  Vaping Use   Vaping Use: Never used  Substance and Sexual Activity   Alcohol use: Yes    Comment: occas.   Drug use: Never   Sexual activity: Not on file  Other Topics  Concern   Not on file  Social History Narrative   Not on file   Social Determinants of Health   Financial Resource Strain: Not on file  Food Insecurity: Not on file  Transportation Needs: Not on file  Physical Activity: Not on file  Stress: Not on file  Social Connections: Not on file  Intimate Partner Violence: Not on file    FAMILY HISTORY: No family history on file.  Review of Systems - Oncology    PHYSICAL EXAMINATION  ECOG PERFORMANCE STATUS: {CHL ONC ECOG CB:[id number]}  Vitals:   10/13/22 1220  BP: 139/64  Pulse: 80  Resp: 18  Temp: 97.7 F (36.5 C)  SpO2: 96%    Physical Exam  LABORATORY DATA:  CBC    Component Value Date/Time   WBC 6.9 10/13/2022 1032   WBC 9.2 09/23/2020 0251   RBC 5.00 10/13/2022 1032   HGB 14.4 10/13/2022 1032   HCT 43.4 10/13/2022 1032   PLT 264 10/13/2022 1032   MCV 86.8 10/13/2022 1032   MCH 28.8 10/13/2022 1032   MCHC 33.2 10/13/2022 1032   RDW 15.7 (H) 10/13/2022 1032   LYMPHSABS 1.6 10/13/2022 1032   MONOABS 0.6 10/13/2022 1032   EOSABS 0.2 10/13/2022 1032   BASOSABS 0.1 10/13/2022 1032    CMP     Component Value Date/Time   NA 132 (L) 10/13/2022 1032   K 4.0 10/13/2022 1032   CL 95 (L) 10/13/2022 1032   CO2 28 10/13/2022 1032   GLUCOSE 120 (H) 10/13/2022 1032   BUN 25 (H) 10/13/2022 1032   CREATININE 1.60 (H) 10/13/2022 1032   CALCIUM 9.7 10/13/2022 1032   PROT 7.2 10/13/2022 1032   ALBUMIN 4.3 10/13/2022 1032   AST 20 10/13/2022 1032   ALT 15 10/13/2022 1032   ALKPHOS 61 10/13/2022 1032   BILITOT 0.3 10/13/2022 1032   GFRNONAA 32 (L) 10/13/2022 1032   GFRAA 38 (L) 02/22/2020 1221       PENDING LABS:   RADIOGRAPHIC STUDIES:  No results found.   PATHOLOGY:     ASSESSMENT and THERAPY PLAN:   No problem-specific Assessment & Plan notes found for this encounter.   No orders of the defined types were placed in this encounter.   All questions were answered. The patient knows to call  the clinic with any problems, questions or concerns. We can certainly see the patient much sooner if necessary. This note was electronically signed. Scot Dock, NP 10/13/2022

## 2022-10-13 NOTE — Patient Instructions (Signed)
Denosumab Injection (Osteoporosis) What is this medication? DENOSUMAB (den oh SUE mab) prevents and treats osteoporosis. It works by making your bones stronger and less likely to break (fracture). It is a monoclonal antibody. This medicine may be used for other purposes; ask your health care provider or pharmacist if you have questions. COMMON BRAND NAME(S): Prolia What should I tell my care team before I take this medication? They need to know if you have any of these conditions: Dental or gum disease, or plan to have dental surgery or a tooth pulled Infection Kidney disease Low levels of calcium or vitamin D in your blood On dialysis Poor nutrition Skin conditions Thyroid disease, or have had thyroid or parathyroid surgery Trouble absorbing minerals in your stomach or intestine An unusual or allergic reaction to denosumab, other medications, foods, dyes, or preservatives Pregnant or trying to get pregnant Breastfeeding How should I use this medication? This medication is injected under the skin. It is given by your care team in a hospital or clinic setting. A special MedGuide will be given to you before each treatment. Be sure to read this information carefully each time. Talk to your care team about the use of this medication in children. Special care may be needed. Overdosage: If you think you have taken too much of this medicine contact a poison control center or emergency room at once. NOTE: This medicine is only for you. Do not share this medicine with others. What if I miss a dose? Keep appointments for follow-up doses. It is important not to miss your dose. Call your care team if you are unable to keep an appointment. What may interact with this medication? Do not take this medication with any of the following: Other medications that contain denosumab This medication may also interact with the following: Medications that lower your chance of fighting infection Steroid  medications, such as prednisone or cortisone This list may not describe all possible interactions. Give your health care provider a list of all the medicines, herbs, non-prescription drugs, or dietary supplements you use. Also tell them if you smoke, drink alcohol, or use illegal drugs. Some items may interact with your medicine. What should I watch for while using this medication? Your condition will be monitored carefully while you are receiving this medication. You may need blood work while taking this medication. This medication may increase your risk of getting an infection. Call your care team for advice if you get a fever, chills, sore throat, or other symptoms of a cold or flu. Do not treat yourself. Try to avoid being around people who are sick. Tell your dentist and dental surgeon that you are taking this medication. You should not have major dental surgery while on this medication. See your dentist to have a dental exam and fix any dental problems before starting this medication. Take good care of your teeth while on this medication. Make sure you see your dentist for regular follow-up appointments. You should make sure you get enough calcium and vitamin D while you are taking this medication. Discuss the foods you eat and the vitamins you take with your care team. Talk to your care team if you are pregnant or think you might be pregnant. This medication can cause serious birth defects if taken during pregnancy and for 5 months after the last dose. You will need a negative pregnancy test before starting this medication. Contraception is recommended while taking this medication and for 5 months after the last dose. Your care   team can help you find the option that works for you. Talk to your care team before breastfeeding. Changes to your treatment plan may be needed. What side effects may I notice from receiving this medication? Side effects that you should report to your care team as soon as  possible: Allergic reactions--skin rash, itching, hives, swelling of the face, lips, tongue, or throat Infection--fever, chills, cough, sore throat, wounds that don't heal, pain or trouble when passing urine, general feeling of discomfort or being unwell Low calcium level--muscle pain or cramps, confusion, tingling, or numbness in the hands or feet Osteonecrosis of the jaw--pain, swelling, or redness in the mouth, numbness of the jaw, poor healing after dental work, unusual discharge from the mouth, visible bones in the mouth Severe bone, joint, or muscle pain Skin infection--skin redness, swelling, warmth, or pain Side effects that usually do not require medical attention (report these to your care team if they continue or are bothersome): Back pain Headache Joint pain Muscle pain Pain in the hands, arms, legs, or feet Runny or stuffy nose Sore throat This list may not describe all possible side effects. Call your doctor for medical advice about side effects. You may report side effects to FDA at 1-800-FDA-1088. Where should I keep my medication? This medication is given in a hospital or clinic. It will not be stored at home. NOTE: This sheet is a summary. It may not cover all possible information. If you have questions about this medicine, talk to your doctor, pharmacist, or health care provider.  2023 Elsevier/Gold Standard (2022-02-03 00:00:00)  

## 2022-10-14 ENCOUNTER — Telehealth: Payer: Self-pay | Admitting: Adult Health

## 2022-10-14 ENCOUNTER — Telehealth: Payer: Self-pay

## 2022-10-14 ENCOUNTER — Encounter: Payer: Self-pay | Admitting: Adult Health

## 2022-10-14 NOTE — Telephone Encounter (Signed)
Scheduled appointments per 1/8 los. Patient is aware.

## 2022-10-14 NOTE — Assessment & Plan Note (Signed)
Holly Simpson is an 87 year old woman with stage 0 ductal carcinoma in situ that was diagnosed in May 2021 and who opted for intensified screening without surgery and radiation therefore she began on anastrozole in May 2021.  1.  DCIS: She is tolerating treatment without difficulty and will continue on anastrozole daily.  She has no clinical or radiographic signs of progression.  Her most recent MRI showed continued improvement of the small DCIS.  She is due for mammogram in April which was ordered today.  2.  Hyponatremia: She tells me that she typically does not drink enough water and that is when her sodium level is decreased.  She tells me that she will be working on this and commit to drink more water.  3.  Osteopenia on aromatase inhibitors: She will continue with Prolia every 6 months.  She has received the [facility] patient education.  She is tolerating this well.  Holly Simpson will return in 6 months for labs and follow-up.  She is recommended to continue to follow-up with her primary care provider for her general health needs in the interim.

## 2022-10-14 NOTE — Telephone Encounter (Signed)
Orders faxed to [facility] per NP. Fax conf received.

## 2023-03-31 ENCOUNTER — Encounter: Payer: Self-pay | Admitting: Adult Health

## 2023-04-10 ENCOUNTER — Other Ambulatory Visit: Payer: Self-pay | Admitting: *Deleted

## 2023-04-10 DIAGNOSIS — D0511 Intraductal carcinoma in situ of right breast: Secondary | ICD-10-CM

## 2023-04-13 ENCOUNTER — Other Ambulatory Visit: Payer: Self-pay

## 2023-04-13 ENCOUNTER — Inpatient Hospital Stay: Payer: Medicare PPO

## 2023-04-13 ENCOUNTER — Inpatient Hospital Stay (HOSPITAL_BASED_OUTPATIENT_CLINIC_OR_DEPARTMENT_OTHER): Payer: Medicare PPO | Admitting: Adult Health

## 2023-04-13 ENCOUNTER — Telehealth: Payer: Self-pay | Admitting: Adult Health

## 2023-04-13 ENCOUNTER — Inpatient Hospital Stay: Payer: Medicare PPO | Attending: Adult Health

## 2023-04-13 ENCOUNTER — Encounter: Payer: Self-pay | Admitting: Adult Health

## 2023-04-13 VITALS — BP 148/74 | HR 103 | Temp 98.1°F | Resp 18 | Wt 178.5 lb

## 2023-04-13 DIAGNOSIS — Z79811 Long term (current) use of aromatase inhibitors: Secondary | ICD-10-CM | POA: Diagnosis not present

## 2023-04-13 DIAGNOSIS — Z9049 Acquired absence of other specified parts of digestive tract: Secondary | ICD-10-CM | POA: Insufficient documentation

## 2023-04-13 DIAGNOSIS — Z87891 Personal history of nicotine dependence: Secondary | ICD-10-CM | POA: Insufficient documentation

## 2023-04-13 DIAGNOSIS — Z8719 Personal history of other diseases of the digestive system: Secondary | ICD-10-CM | POA: Insufficient documentation

## 2023-04-13 DIAGNOSIS — D0511 Intraductal carcinoma in situ of right breast: Secondary | ICD-10-CM | POA: Diagnosis present

## 2023-04-13 DIAGNOSIS — H43391 Other vitreous opacities, right eye: Secondary | ICD-10-CM | POA: Diagnosis not present

## 2023-04-13 DIAGNOSIS — H2513 Age-related nuclear cataract, bilateral: Secondary | ICD-10-CM | POA: Insufficient documentation

## 2023-04-13 DIAGNOSIS — Z79899 Other long term (current) drug therapy: Secondary | ICD-10-CM | POA: Insufficient documentation

## 2023-04-13 DIAGNOSIS — M8589 Other specified disorders of bone density and structure, multiple sites: Secondary | ICD-10-CM | POA: Diagnosis not present

## 2023-04-13 DIAGNOSIS — Z9089 Acquired absence of other organs: Secondary | ICD-10-CM | POA: Diagnosis not present

## 2023-04-13 DIAGNOSIS — M8588 Other specified disorders of bone density and structure, other site: Secondary | ICD-10-CM

## 2023-04-13 LAB — CMP (CANCER CENTER ONLY)
ALT: 8 U/L (ref 0–44)
AST: 15 U/L (ref 15–41)
Albumin: 4.1 g/dL (ref 3.5–5.0)
Alkaline Phosphatase: 57 U/L (ref 38–126)
Anion gap: 9 (ref 5–15)
BUN: 22 mg/dL (ref 8–23)
CO2: 29 mmol/L (ref 22–32)
Calcium: 9.8 mg/dL (ref 8.9–10.3)
Chloride: 97 mmol/L — ABNORMAL LOW (ref 98–111)
Creatinine: 1.79 mg/dL — ABNORMAL HIGH (ref 0.44–1.00)
GFR, Estimated: 27 mL/min — ABNORMAL LOW (ref >60)
Glucose, Bld: 109 mg/dL — ABNORMAL HIGH (ref 70–99)
Potassium: 4.6 mmol/L (ref 3.5–5.1)
Sodium: 135 mmol/L (ref 135–145)
Total Bilirubin: 0.5 mg/dL (ref 0.3–1.2)
Total Protein: 7.6 g/dL (ref 6.5–8.1)

## 2023-04-13 LAB — CBC WITH DIFFERENTIAL (CANCER CENTER ONLY)
Abs Immature Granulocytes: 0.03 10*3/uL (ref 0.00–0.07)
Basophils Absolute: 0.1 10*3/uL (ref 0.0–0.1)
Basophils Relative: 1 %
Eosinophils Absolute: 0.2 10*3/uL (ref 0.0–0.5)
Eosinophils Relative: 3 %
HCT: 44 % (ref 36.0–46.0)
Hemoglobin: 14.3 g/dL (ref 12.0–15.0)
Immature Granulocytes: 0 %
Lymphocytes Relative: 25 %
Lymphs Abs: 2.1 10*3/uL (ref 0.7–4.0)
MCH: 29.2 pg (ref 26.0–34.0)
MCHC: 32.5 g/dL (ref 30.0–36.0)
MCV: 89.8 fL (ref 80.0–100.0)
Monocytes Absolute: 0.9 10*3/uL (ref 0.1–1.0)
Monocytes Relative: 11 %
Neutro Abs: 5.1 10*3/uL (ref 1.7–7.7)
Neutrophils Relative %: 60 %
Platelet Count: 281 10*3/uL (ref 150–400)
RBC: 4.9 MIL/uL (ref 3.87–5.11)
RDW: 14.9 % (ref 11.5–15.5)
WBC Count: 8.4 10*3/uL (ref 4.0–10.5)
nRBC: 0 % (ref 0.0–0.2)

## 2023-04-13 MED ORDER — DENOSUMAB 60 MG/ML ~~LOC~~ SOSY
60.0000 mg | PREFILLED_SYRINGE | Freq: Once | SUBCUTANEOUS | Status: AC
  Administered 2023-04-13: 60 mg via SUBCUTANEOUS
  Filled 2023-04-13: qty 1

## 2023-04-13 NOTE — Progress Notes (Signed)
[facility] Follow up:    Holly Simpson [address] [place] [address]   DIAGNOSIS:  Cancer Staging  Ductal carcinoma in situ (DCIS) of right breast Staging form: Breast, AJCC 8th Edition - Clinical stage from 02/22/2020: Stage 0 (cTis (DCIS), cN0, cM0) - Unsigned Stage prefix: Initial diagnosis   SUMMARY OF ONCOLOGIC HISTORY: Holly Simpson, [place] woman status post right breast biopsy 02/13/2020 for ductal carcinoma in situ, grade 2, strongly estrogen and progesterone receptor positive             (a) the patient originally palpated a mass, subsequent ultrasound did not document a mass   (1) patient opted for intensified screening with no surgery or radiation (and opted against COMET)             (a) mammography every April             (b) breast MRI every October   (2) anastrozole started 02/22/2020             (a) bone density scan at [facility] 03/06/2021 showed a T score of -2.2             (B) on prolia every 6 months  CURRENT THERAPY: Anastrozole/Prolia  INTERVAL HISTORY: Holly Simpson 87 y.o. female returns for follow-up of her DCIS.  She opted for observation instead of treatment.  Her most recent mammogram occurred on March 25, 2023 demonstrating stability in the known calcifications/intermediate grade DCIS.  Most recent breast MRI occurred on July 07, 2022 demonstrating an interval decrease in her lower inner right breast DCIS now at 0.4 cm but no other abnormality noted.  She continues on anastrozole daily.  Her most recent bone density we have on record was March 06, 2021 demonstrating osteopenia with a T-score of -2.2 in the right femur.  She receives Prolia every 6 months.  She is tolerating Anastrozole and Prolia regularly.  She has not yet undergone repeat bone density testing that was due in 03/2021.   Patient Active Problem List   Diagnosis Date Noted   Nonruptured cerebral aneurysm 12/11/2021   Osteopenia of spine  03/25/2021   Early dry stage nonexudative age-related macular degeneration of both eyes 01/15/2021   Hypertensive retinopathy of both eyes 01/15/2021   Osteoarthritis of left hip 09/20/2020   Ductal carcinoma in situ (DCIS) of right breast 02/16/2020   H/O acute pancreatitis 11/30/2019   High cholesterol 11/30/2019   Hyponatremia 03/16/2019   Prediabetes 09/10/2018   Hyperglycemia 05/10/2018   Pain in both lower legs 01/06/2018   Carpal tunnel syndrome of right wrist 10/09/2017   Numbness and tingling in right hand 04/29/2017   Encounter for health maintenance examination 05/06/2016   Impaired fasting glucose 05/06/2016   Nuclear sclerotic cataract of left eye 04/03/2016   Age-related nuclear cataract of both eyes 10/28/2015   Chronic kidney disease, stage III (moderate) ([facility]) 10/28/2015   Dermatochalasis of both upper eyelids 10/28/2015   GERD without esophagitis 10/28/2015   Hearing loss 10/28/2015   High risk medication use 10/28/2015   HSV-1 infection 10/28/2015   Hyperkalemia 10/28/2015   Keratoconjunctivitis sicca of both eyes not specified as Sjogren's 10/28/2015   Neck pain 10/28/2015   Class 2 severe obesity due to excess calories with serious comorbidity and body mass index (BMI) of 35.0 to 35.9 in adult [facility]) 10/28/2015   Osteoarthritis 10/28/2015   Vitreous floaters of right eye 10/28/2015   High blood pressure 06/19/2014  Hypothyroidism 06/19/2014   Primary osteoarthritis of right knee 06/19/2014   Vitamin D deficiency 06/19/2014    has No Known Allergies.  MEDICAL HISTORY: Past Medical History:  Diagnosis Date   Arthritis    Asthma    Cancer ([facility])    skin ankle,breast   Hypertension    Pre-diabetes     SURGICAL HISTORY: Past Surgical History:  Procedure Laterality Date   APPENDECTOMY     BACK SURGERY     L3-4   CHOLECYSTECTOMY     TONSILLECTOMY     TOTAL HIP ARTHROPLASTY Left 09/20/2020   Procedure: TOTAL HIP ARTHROPLASTY ANTERIOR APPROACH;   Surgeon: Samson Frederic, MD;  Location: [facility];  Service: Orthopedics;  Laterality: Left;   triguer finguer      SOCIAL HISTORY: Social History   Socioeconomic History   Marital status: Single    Spouse name: Not on file   Number of children: Not on file   Years of education: Not on file   Highest education level: Not on file  Occupational History   Not on file  Tobacco Use   Smoking status: Former   Smokeless tobacco: Never   Tobacco comments:    Quit 20 years ago  Vaping Use   Vaping Use: Never used  Substance and Sexual Activity   Alcohol use: Yes    Comment: occas.   Drug use: Never   Sexual activity: Not on file  Other Topics Concern   Not on file  Social History Narrative   Not on file   Social Determinants of Health   Financial Resource Strain: Not on file  Food Insecurity: Not on file  Transportation Needs: Not on file  Physical Activity: Not on file  Stress: Not on file  Social Connections: Not on file  Intimate Partner Violence: Not on file    FAMILY HISTORY: Non contributory  Review of Systems  Constitutional:  Negative for appetite change, chills, fatigue, fever and unexpected weight change.  HENT:   Negative for hearing loss, lump/mass and trouble swallowing.   Eyes:  Negative for eye problems and icterus.  Respiratory:  Negative for chest tightness, cough and shortness of breath.   Cardiovascular:  Negative for chest pain, leg swelling and palpitations.  Gastrointestinal:  Negative for abdominal distention, abdominal pain, constipation, diarrhea, nausea and vomiting.  Endocrine: Negative for hot flashes.  Genitourinary:  Negative for difficulty urinating.   Musculoskeletal:  Negative for arthralgias.  Skin:  Negative for itching and rash.  Neurological:  Negative for dizziness, extremity weakness, headaches and numbness.  Hematological:  Negative for adenopathy. Does not bruise/bleed easily.  Psychiatric/Behavioral:  Negative for depression.  The patient is not nervous/anxious.       PHYSICAL EXAMINATION    Vitals:   04/13/23 1148  BP: (!) 148/74  Pulse: (!) 103  Resp: 18  Temp: 98.1 F (36.7 C)  SpO2: 93%    Physical Exam Constitutional:      General: She is not in acute distress.    Appearance: Normal appearance. She is not toxic-appearing.  HENT:     Head: Normocephalic and atraumatic.     Mouth/Throat:     Mouth: Mucous membranes are moist.     Pharynx: Oropharynx is clear. No oropharyngeal exudate or posterior oropharyngeal erythema.  Eyes:     General: No scleral icterus. Cardiovascular:     Rate and Rhythm: Normal rate and regular rhythm.     Pulses: Normal pulses.     Heart sounds: Normal  heart sounds.  Pulmonary:     Effort: Pulmonary effort is normal.     Breath sounds: Normal breath sounds.  Abdominal:     General: Abdomen is flat. Bowel sounds are normal. There is no distension.     Palpations: Abdomen is soft.     Tenderness: There is no abdominal tenderness.  Musculoskeletal:        General: No swelling.     Cervical back: Neck supple.  Lymphadenopathy:     Cervical: No cervical adenopathy.  Skin:    General: Skin is warm and dry.     Findings: No rash.  Neurological:     General: No focal deficit present.     Mental Status: She is alert.  Psychiatric:        Mood and Affect: Mood normal.        Behavior: Behavior normal.     LABORATORY DATA:  CBC    Component Value Date/Time   WBC 8.4 04/13/2023 1103   WBC 9.2 09/23/2020 0251   RBC 4.90 04/13/2023 1103   HGB 14.3 04/13/2023 1103   HCT 44.0 04/13/2023 1103   PLT 281 04/13/2023 1103   MCV 89.8 04/13/2023 1103   MCH 29.2 04/13/2023 1103   MCHC 32.5 04/13/2023 1103   RDW 14.9 04/13/2023 1103   LYMPHSABS 2.1 04/13/2023 1103   MONOABS 0.9 04/13/2023 1103   EOSABS 0.2 04/13/2023 1103   BASOSABS 0.1 04/13/2023 1103    CMP     Component Value Date/Time   NA 135 04/13/2023 1103   K 4.6 04/13/2023 1103   CL 97 (L)  04/13/2023 1103   CO2 29 04/13/2023 1103   GLUCOSE 109 (H) 04/13/2023 1103   BUN 22 04/13/2023 1103   CREATININE 1.79 (H) 04/13/2023 1103   CALCIUM 9.8 04/13/2023 1103   PROT 7.6 04/13/2023 1103   ALBUMIN 4.1 04/13/2023 1103   AST 15 04/13/2023 1103   ALT 8 04/13/2023 1103   ALKPHOS 57 04/13/2023 1103   BILITOT 0.5 04/13/2023 1103   GFRNONAA 27 (L) 04/13/2023 1103   GFRAA 38 (L) 02/22/2020 1221        ASSESSMENT and THERAPY PLAN:  Holly Simpson is an 87 year old woman with stage 0 ductal carcinoma in situ that was diagnosed in May 2021 and who opted for intensified screening without surgery and radiation therefore she began on anastrozole in May 2021.   1.  DCIS: No signs of progression.  She continues on on Anastrozole daily with good tolerance.  Repeat breast MRI due 09/2023, repeat mammogram due 03/2024.   2.  Osteopenia on aromatase inhibitors: She will continue with Prolia every 6 months.  Her calcium today is 9.8.  We reviewed her CKD and creatinine today.  I recommended she increase her water intake, and take extra calcium with 2 tums on the day of her injection since CKD can increase her risk for hypocalcemia. Holly Simpson verbalized understanding of this.  \My nurse reached out to [facility] and verified that they have orders for bone density testing and will call Holly Simpson to get this scheduled.   Holly Simpson will return in 6 months for labs and follow-up. She knows to call for any questions or concerns that may arise between now and her next appointment.   All questions were answered. The patient knows to call the clinic with any problems, questions or concerns. We can certainly see the patient much sooner if necessary.  Total encounter time:30 minutes*in face-to-face visit time, chart review, lab review, care coordination,  order entry, and documentation of the encounter time.    Lillard Anes, NP 04/13/23 12:39 PM Medical Oncology and Hematology [facility] [address]  [place], [place] [address] Tel. [phone]    Fax. [phone]  *Total Encounter Time as defined by the Centers for Medicare and Medicaid Services includes, in addition to the face-to-face time of a patient visit (documented in the note above) non-face-to-face time: obtaining and reviewing outside history, ordering and reviewing medications, tests or procedures, care coordination (communications with other health care professionals or caregivers) and documentation in the medical record.

## 2023-04-13 NOTE — Assessment & Plan Note (Signed)
Holly Simpson is an 87 year old woman with stage 0 ductal carcinoma in situ that was diagnosed in May 2021 and who opted for intensified screening without surgery and radiation therefore she began on anastrozole in May 2021.  1.  DCIS: No signs of progression.  She continues on on Anastrozole daily with good tolerance.  Repeat breast MRI due 09/2023, repeat mammogram due 03/2024.  2.  Osteopenia on aromatase inhibitors: She will continue with Prolia every 6 months.  Her calcium today is 9.8.  We reviewed her CKD and creatinine today.  I recommended she increase her water intake, and take extra calcium with 2 tums on the day of her injection since CKD can increase her risk for hypocalcemia. Holly Simpson verbalized understanding of this.  \My nurse reached out to [facility] and verified that they have orders for bone density testing and will call Holly Simpson to get this scheduled.  Holly Simpson will return in 6 months for labs and follow-up. She knows to call for any questions or concerns that may arise between now and her next appointment.

## 2023-04-13 NOTE — Telephone Encounter (Signed)
Scheduled appointments per 7/8 los. Patient is aware of the made appointments.  

## 2023-09-14 ENCOUNTER — Telehealth: Payer: Self-pay | Admitting: Adult Health

## 2023-09-14 NOTE — Telephone Encounter (Signed)
Left patient a message in regards to appointment time changes on 10/14/2023

## 2023-10-09 ENCOUNTER — Other Ambulatory Visit: Payer: Self-pay | Admitting: Adult Health

## 2023-10-13 ENCOUNTER — Other Ambulatory Visit: Payer: Self-pay

## 2023-10-13 DIAGNOSIS — D0511 Intraductal carcinoma in situ of right breast: Secondary | ICD-10-CM

## 2023-10-14 ENCOUNTER — Telehealth: Payer: Self-pay

## 2023-10-14 ENCOUNTER — Inpatient Hospital Stay: Payer: Medicare PPO

## 2023-10-14 ENCOUNTER — Inpatient Hospital Stay: Payer: Medicare PPO | Attending: Adult Health

## 2023-10-14 ENCOUNTER — Inpatient Hospital Stay: Payer: Medicare PPO | Admitting: Adult Health

## 2023-10-14 NOTE — Telephone Encounter (Signed)
Open in error

## 2023-10-14 NOTE — Telephone Encounter (Signed)
 Called pt. 3 times cell phone and home phone to see if she was coming to her appt. No answer. Lvm

## 2024-07-17 ENCOUNTER — Other Ambulatory Visit: Payer: Self-pay | Admitting: Adult Health

## 2024-07-19 ENCOUNTER — Inpatient Hospital Stay: Admitting: Adult Health

## 2024-07-20 ENCOUNTER — Inpatient Hospital Stay: Attending: Adult Health | Admitting: Adult Health

## 2024-07-20 DIAGNOSIS — D0511 Intraductal carcinoma in situ of right breast: Secondary | ICD-10-CM

## 2024-07-20 DIAGNOSIS — Z79811 Long term (current) use of aromatase inhibitors: Secondary | ICD-10-CM | POA: Diagnosis not present

## 2024-07-20 MED ORDER — ANASTROZOLE 1 MG PO TABS: 1.0000 mg | ORAL_TABLET | Freq: Every day | ORAL | 4 refills | Status: AC

## 2024-07-20 NOTE — Progress Notes (Signed)
 [facility] Cancer Follow up:    Holly Tully Holly DEVONNA [address] [place] [address]   DIAGNOSIS:  Cancer Staging  Ductal carcinoma in situ (DCIS) of right breast Staging form: Breast, AJCC 8th Edition - Clinical stage from 02/22/2020: Stage 0 (cTis (DCIS), cN0, cM0) - Unsigned Stage prefix: Initial diagnosis  I connected with Ronal Tilton Simpson on 07/22/24 at  9:00 AM EDT by telephone and verified that I am speaking with the correct person using two identifiers.  I discussed the limitations, risks, security and privacy concerns of performing an evaluation and management service by telephone and the availability of in person appointments.  I also discussed with the patient that there may be a patient responsible charge related to this service. The patient expressed understanding and agreed to proceed.  Patient location: home Provider location: [facility] office   SUMMARY OF ONCOLOGIC HISTORY: Holly Simpson, [place] woman status post right breast biopsy 02/13/2020 for ductal carcinoma in situ, grade 2, strongly estrogen and progesterone receptor positive             (a) the patient originally palpated a mass, subsequent ultrasound did not document a mass   (1) patient opted for intensified screening with no surgery or radiation (and opted against COMET)             (a) mammography every April             (b) breast MRI every October   (2) anastrozole  started 02/22/2020             (a) bone density scan at [facility] 03/06/2021 showed a T score of -2.2             (B) on prolia  every 6 months  CURRENT THERAPY:Anastrozole   INTERVAL HISTORY:  Holly Simpson 88 y.o. female returns for f/u of her breast cancer on treatment with anastrozole .  She has not come in for her appointment in over one year, and has rescheduled a few appointments.  She let me know she is taking the anastrozole  daily and tolerates it without difficulty.  Her most recent mammogram occurred in  03/2023.   Patient Active Problem List   Diagnosis Date Noted   Nonruptured cerebral aneurysm 12/11/2021   Osteopenia of spine 03/25/2021   Early dry stage nonexudative age-related macular degeneration of both eyes 01/15/2021   Hypertensive retinopathy of both eyes 01/15/2021   Osteoarthritis of left hip 09/20/2020   Ductal carcinoma in situ (DCIS) of right breast 02/16/2020   H/O acute pancreatitis 11/30/2019   High cholesterol 11/30/2019   Hyponatremia 03/16/2019   Prediabetes 09/10/2018   Hyperglycemia 05/10/2018   Pain in both lower legs 01/06/2018   Carpal tunnel syndrome of right wrist 10/09/2017   Numbness and tingling in right hand 04/29/2017   Encounter for health maintenance examination 05/06/2016   Impaired fasting glucose 05/06/2016   Nuclear sclerotic cataract of left eye 04/03/2016   Age-related nuclear cataract of both eyes 10/28/2015   Chronic kidney disease, stage III (moderate) (HCC) 10/28/2015   Dermatochalasis of both upper eyelids 10/28/2015   GERD without esophagitis 10/28/2015   Hearing loss 10/28/2015   High risk medication use 10/28/2015   HSV-1 infection 10/28/2015   Hyperkalemia 10/28/2015   Keratoconjunctivitis sicca of both eyes not specified as Sjogren's 10/28/2015   Neck pain 10/28/2015   Class 2 severe obesity due to excess calories with serious comorbidity and body mass index (BMI) of 35.0 to 35.9 in  adult 10/28/2015   Osteoarthritis 10/28/2015   Vitreous floaters of right eye 10/28/2015   High blood pressure 06/19/2014   Hypothyroidism 06/19/2014   Primary osteoarthritis of right knee 06/19/2014   Vitamin D  deficiency 06/19/2014    has no known allergies.  MEDICAL HISTORY: Past Medical History:  Diagnosis Date   Arthritis    Asthma    Cancer (HCC)    skin ankle,breast   Hypertension    Pre-diabetes     SURGICAL HISTORY: Past Surgical History:  Procedure Laterality Date   APPENDECTOMY     BACK SURGERY     L3-4    CHOLECYSTECTOMY     TONSILLECTOMY     TOTAL HIP ARTHROPLASTY Left 09/20/2020   Procedure: TOTAL HIP ARTHROPLASTY ANTERIOR APPROACH;  Surgeon: Fidel Rogue, MD;  Location: [facility];  Service: Orthopedics;  Laterality: Left;   triguer finguer      SOCIAL HISTORY: Social History   Socioeconomic History   Marital status: Single    Spouse name: Not on file   Number of children: Not on file   Years of education: Not on file   Highest education level: Not on file  Occupational History   Not on file  Tobacco Use   Smoking status: Former   Smokeless tobacco: Never   Tobacco comments:    Quit 20 years ago  Vaping Use   Vaping status: Never Used  Substance and Sexual Activity   Alcohol  use: Yes    Comment: occas.   Drug use: Never   Sexual activity: Not on file  Other Topics Concern   Not on file  Social History Narrative   Not on file   Social Drivers of Health   Financial Resource Strain: Not on file  Food Insecurity: Low Risk  (05/10/2024)   Received from [facility]   Hunger Vital Sign    Within the past 12 months, you worried that your food would run out before you got money to buy more: Never true    Within the past 12 months, the food you bought just didn't last and you didn't have money to get more. : Never true  Transportation Needs: No Transportation Needs (05/10/2024)   Received from [facility]    In the past 12 months, has lack of reliable transportation kept you from medical appointments, meetings, work or from getting things needed for daily living? : No  Physical Activity: Not on file  Stress: Not on file  Social Connections: Unknown (02/15/2022)   [facility] from [facility]   Social Network    Social Network: Not on file  Intimate Partner Violence: Unknown (01/08/2022)   Received from [facility]   HITS    Physically Hurt: Not on file    Insult or Talk Down To: Not on file    Threaten Physical Harm: Not on file    Scream or Curse: Not  on file    FAMILY HISTORY: Non-contributory  Review of Systems  Constitutional:  Negative for appetite change, chills, fatigue, fever and unexpected weight change.  HENT:   Negative for hearing loss, lump/mass and trouble swallowing.   Eyes:  Negative for eye problems and icterus.  Respiratory:  Negative for chest tightness, cough and shortness of breath.   Cardiovascular:  Negative for chest pain, leg swelling and palpitations.  Gastrointestinal:  Negative for abdominal distention, abdominal pain, constipation, diarrhea, nausea and vomiting.  Endocrine: Negative for hot flashes.  Genitourinary:  Negative for difficulty urinating.  Musculoskeletal:  Negative for arthralgias.  Skin:  Negative for itching and rash.  Neurological:  Negative for dizziness, extremity weakness, headaches and numbness.  Hematological:  Negative for adenopathy. Does not bruise/bleed easily.  Psychiatric/Behavioral:  Negative for depression. The patient is not nervous/anxious.       PHYSICAL EXAMINATION  patient sounds well, in no apparent distress    ASSESSMENT and THERAPY PLAN:   Ductal carcinoma in situ (DCIS) of right breast Tilton is an 88 year old woman with stage 0 ductal carcinoma in situ that was diagnosed in May 2021 and who opted for intensified screening without surgery and radiation therefore she began on anastrozole  in May 2021.  1.  DCIS: Continue anastrozole .  I reviewed with her that although she tolerates the anastrozole  well, we still need to monitor her DCIS to ensure the anastrozole  is still working.  I placed orders for a bilateral diagnostic mammogram and ultrasound to monitor her DCIS.  She verbalized understanding and let me know she would come into clinic and would keep future appointments for imaging and follow up.    2. Osteoporosis: was previously on Prolia , lost to follow up as noted above.  Uncertain if she underwent bone density testing at [facility], results are not readily  available.  Will reach out to their office to evaluate.    She will return in 6 months for follow-up.      Follow up instructions:    -Return to cancer center in 6 months  -Mammogram due    The patient was provided an opportunity to ask questions and all were answered. The patient agreed with the plan and demonstrated an understanding of the instructions.   The patient was advised to call back or seek an in-person evaluation if the symptoms worsen or if the condition fails to improve as anticipated.   I provided 15 minutes of non face-to-face telephone visit time during this encounter, and > 50% was spent counseling as documented under my assessment & plan.  Morna Kendall, NP 07/22/24 5:08 AM Medical Oncology and Hematology [facility] [address] [place], [place] [address] Tel. [phone]    Fax. [phone]  *Total Encounter Time as defined by the Centers for Medicare and Medicaid Services includes, in addition to the face-to-face time of a patient visit (documented in the note above) non-face-to-face time: obtaining and reviewing outside history, ordering and reviewing medications, tests or procedures, care coordination (communications with other health care professionals or caregivers) and documentation in the medical record.

## 2024-07-21 ENCOUNTER — Telehealth: Payer: Self-pay | Admitting: Adult Health

## 2024-07-21 NOTE — Telephone Encounter (Signed)
 left vm for pt about scheduled appt date and time. Encouraged to call back if need to reschedule

## 2024-07-22 ENCOUNTER — Encounter: Payer: Self-pay | Admitting: Adult Health

## 2024-07-22 NOTE — Assessment & Plan Note (Addendum)
 Holly Simpson is an 88 year old woman with stage 0 ductal carcinoma in situ that was diagnosed in May 2021 and who opted for intensified screening without surgery and radiation therefore she began on anastrozole  in May 2021.  1.  DCIS: Continue anastrozole .  I reviewed with her that although she tolerates the anastrozole  well, we still need to monitor her DCIS to ensure the anastrozole  is still working.  I placed orders for a bilateral diagnostic mammogram and ultrasound to monitor her DCIS.  She verbalized understanding and let me know she would come into clinic and would keep future appointments for imaging and follow up.    2. Osteoporosis: was previously on Prolia , lost to follow up as noted above.  Uncertain if she underwent bone density testing at [facility], results are not readily available.  Will reach out to their office to evaluate.    She will return in 6 months for follow-up.

## 2025-01-19 ENCOUNTER — Inpatient Hospital Stay

## 2025-01-19 ENCOUNTER — Inpatient Hospital Stay: Attending: Adult Health | Admitting: Adult Health
